# Patient Record
Sex: Female | Born: 1945
Health system: Southern US, Community
[De-identification: ages and names within clinical notes are randomized; demographics above are authoritative.]

## PROBLEM LIST (undated history)

## (undated) DIAGNOSIS — D509 Iron deficiency anemia, unspecified: Secondary | ICD-10-CM

## (undated) DIAGNOSIS — I10 Essential (primary) hypertension: Secondary | ICD-10-CM

## (undated) DIAGNOSIS — R7303 Prediabetes: Secondary | ICD-10-CM

## (undated) DIAGNOSIS — F419 Anxiety disorder, unspecified: Secondary | ICD-10-CM

## (undated) DIAGNOSIS — M1990 Unspecified osteoarthritis, unspecified site: Secondary | ICD-10-CM

## (undated) DIAGNOSIS — K219 Gastro-esophageal reflux disease without esophagitis: Secondary | ICD-10-CM

## (undated) DIAGNOSIS — E871 Hypo-osmolality and hyponatremia: Secondary | ICD-10-CM

## (undated) DIAGNOSIS — J309 Allergic rhinitis, unspecified: Secondary | ICD-10-CM

## (undated) DIAGNOSIS — M2550 Pain in unspecified joint: Secondary | ICD-10-CM

## (undated) DIAGNOSIS — M5412 Radiculopathy, cervical region: Secondary | ICD-10-CM

## (undated) DIAGNOSIS — J45909 Unspecified asthma, uncomplicated: Secondary | ICD-10-CM

## (undated) HISTORY — DX: Allergic rhinitis, unspecified: J30.9

## (undated) HISTORY — DX: Anxiety disorder, unspecified: F41.9

## (undated) HISTORY — DX: Hypo-osmolality and hyponatremia: E87.1

## (undated) HISTORY — DX: Essential (primary) hypertension: I10

## (undated) HISTORY — DX: Unspecified asthma, uncomplicated: J45.909

## (undated) HISTORY — DX: Radiculopathy, cervical region: M54.12

## (undated) HISTORY — DX: Gastro-esophageal reflux disease without esophagitis: K21.9

## (undated) HISTORY — PX: TOTAL ABDOMINAL HYSTERECTOMY W/ BILATERAL SALPINGOOPHORECTOMY: SHX83

## (undated) HISTORY — DX: Pain in unspecified joint: M25.50

## (undated) HISTORY — DX: Unspecified osteoarthritis, unspecified site: M19.90

## (undated) HISTORY — PX: OTHER SURGICAL HISTORY: SHX169

## (undated) HISTORY — DX: Iron deficiency anemia, unspecified: D50.9

---

## 1998-05-31 ENCOUNTER — Other Ambulatory Visit: Admission: RE | Admit: 1998-05-31 | Discharge: 1998-05-31 | Payer: Self-pay | Admitting: Obstetrics and Gynecology

## 1999-07-05 ENCOUNTER — Ambulatory Visit (HOSPITAL_COMMUNITY): Admission: RE | Admit: 1999-07-05 | Discharge: 1999-07-05 | Payer: Self-pay | Admitting: Gastroenterology

## 1999-07-06 ENCOUNTER — Ambulatory Visit (HOSPITAL_COMMUNITY): Admission: RE | Admit: 1999-07-06 | Discharge: 1999-07-06 | Payer: Self-pay | Admitting: Gastroenterology

## 1999-07-06 ENCOUNTER — Encounter: Payer: Self-pay | Admitting: Gastroenterology

## 1999-08-15 ENCOUNTER — Ambulatory Visit (HOSPITAL_BASED_OUTPATIENT_CLINIC_OR_DEPARTMENT_OTHER): Admission: RE | Admit: 1999-08-15 | Discharge: 1999-08-15 | Payer: Self-pay | Admitting: *Deleted

## 1999-08-23 ENCOUNTER — Other Ambulatory Visit: Admission: RE | Admit: 1999-08-23 | Discharge: 1999-08-23 | Payer: Self-pay | Admitting: *Deleted

## 1999-09-19 ENCOUNTER — Encounter: Payer: Self-pay | Admitting: Internal Medicine

## 1999-09-19 ENCOUNTER — Encounter: Admission: RE | Admit: 1999-09-19 | Discharge: 1999-09-19 | Payer: Self-pay | Admitting: Internal Medicine

## 1999-10-10 ENCOUNTER — Encounter: Payer: Self-pay | Admitting: Internal Medicine

## 1999-10-10 ENCOUNTER — Encounter: Admission: RE | Admit: 1999-10-10 | Discharge: 1999-10-10 | Payer: Self-pay | Admitting: Internal Medicine

## 2000-05-17 ENCOUNTER — Emergency Department (HOSPITAL_COMMUNITY): Admission: EM | Admit: 2000-05-17 | Discharge: 2000-05-17 | Payer: Self-pay | Admitting: Emergency Medicine

## 2000-05-17 ENCOUNTER — Encounter: Payer: Self-pay | Admitting: Emergency Medicine

## 2000-12-03 ENCOUNTER — Other Ambulatory Visit: Admission: RE | Admit: 2000-12-03 | Discharge: 2000-12-03 | Payer: Self-pay | Admitting: Obstetrics and Gynecology

## 2001-02-09 ENCOUNTER — Encounter: Payer: Self-pay | Admitting: Internal Medicine

## 2001-02-09 ENCOUNTER — Encounter: Admission: RE | Admit: 2001-02-09 | Discharge: 2001-02-09 | Payer: Self-pay | Admitting: Internal Medicine

## 2001-07-29 ENCOUNTER — Encounter: Payer: Self-pay | Admitting: Internal Medicine

## 2001-07-29 ENCOUNTER — Encounter: Admission: RE | Admit: 2001-07-29 | Discharge: 2001-07-29 | Payer: Self-pay | Admitting: Internal Medicine

## 2001-10-15 ENCOUNTER — Encounter: Admission: RE | Admit: 2001-10-15 | Discharge: 2001-11-10 | Payer: Self-pay | Admitting: Internal Medicine

## 2001-12-09 ENCOUNTER — Encounter: Payer: Self-pay | Admitting: Internal Medicine

## 2001-12-09 ENCOUNTER — Encounter: Admission: RE | Admit: 2001-12-09 | Discharge: 2001-12-09 | Payer: Self-pay | Admitting: Internal Medicine

## 2001-12-30 ENCOUNTER — Other Ambulatory Visit: Admission: RE | Admit: 2001-12-30 | Discharge: 2001-12-30 | Payer: Self-pay | Admitting: Obstetrics and Gynecology

## 2002-02-12 ENCOUNTER — Encounter: Admission: RE | Admit: 2002-02-12 | Discharge: 2002-02-12 | Payer: Self-pay | Admitting: Internal Medicine

## 2002-02-12 ENCOUNTER — Encounter: Payer: Self-pay | Admitting: Internal Medicine

## 2002-04-28 ENCOUNTER — Encounter: Payer: Self-pay | Admitting: Internal Medicine

## 2002-04-28 ENCOUNTER — Encounter: Admission: RE | Admit: 2002-04-28 | Discharge: 2002-04-28 | Payer: Self-pay | Admitting: Internal Medicine

## 2002-04-28 ENCOUNTER — Inpatient Hospital Stay (HOSPITAL_COMMUNITY): Admission: AD | Admit: 2002-04-28 | Discharge: 2002-04-30 | Payer: Self-pay | Admitting: Internal Medicine

## 2002-04-29 ENCOUNTER — Encounter: Payer: Self-pay | Admitting: Internal Medicine

## 2002-06-17 ENCOUNTER — Emergency Department (HOSPITAL_COMMUNITY): Admission: EM | Admit: 2002-06-17 | Discharge: 2002-06-17 | Payer: Self-pay | Admitting: *Deleted

## 2002-06-23 ENCOUNTER — Encounter: Payer: Self-pay | Admitting: Neurosurgery

## 2002-06-23 ENCOUNTER — Inpatient Hospital Stay (HOSPITAL_COMMUNITY): Admission: RE | Admit: 2002-06-23 | Discharge: 2002-06-25 | Payer: Self-pay | Admitting: Neurosurgery

## 2002-10-05 ENCOUNTER — Encounter: Payer: Self-pay | Admitting: Emergency Medicine

## 2002-10-05 ENCOUNTER — Emergency Department (HOSPITAL_COMMUNITY): Admission: EM | Admit: 2002-10-05 | Discharge: 2002-10-05 | Payer: Self-pay | Admitting: Emergency Medicine

## 2003-02-15 ENCOUNTER — Other Ambulatory Visit: Admission: RE | Admit: 2003-02-15 | Discharge: 2003-02-15 | Payer: Self-pay | Admitting: Obstetrics & Gynecology

## 2003-02-16 ENCOUNTER — Encounter: Admission: RE | Admit: 2003-02-16 | Discharge: 2003-02-16 | Payer: Self-pay | Admitting: Neurosurgery

## 2003-02-16 ENCOUNTER — Encounter: Payer: Self-pay | Admitting: Neurosurgery

## 2003-03-02 ENCOUNTER — Encounter: Payer: Self-pay | Admitting: Neurosurgery

## 2003-03-02 ENCOUNTER — Encounter: Admission: RE | Admit: 2003-03-02 | Discharge: 2003-03-02 | Payer: Self-pay | Admitting: Neurosurgery

## 2003-03-17 ENCOUNTER — Encounter: Admission: RE | Admit: 2003-03-17 | Discharge: 2003-03-17 | Payer: Self-pay | Admitting: Internal Medicine

## 2003-03-17 ENCOUNTER — Encounter: Payer: Self-pay | Admitting: Internal Medicine

## 2003-03-25 ENCOUNTER — Inpatient Hospital Stay (HOSPITAL_COMMUNITY): Admission: RE | Admit: 2003-03-25 | Discharge: 2003-03-29 | Payer: Self-pay | Admitting: Neurosurgery

## 2003-03-25 ENCOUNTER — Encounter: Payer: Self-pay | Admitting: Neurosurgery

## 2003-07-26 ENCOUNTER — Emergency Department (HOSPITAL_COMMUNITY): Admission: AD | Admit: 2003-07-26 | Discharge: 2003-07-26 | Payer: Self-pay | Admitting: Family Medicine

## 2004-02-09 ENCOUNTER — Encounter: Admission: RE | Admit: 2004-02-09 | Discharge: 2004-04-05 | Payer: Self-pay | Admitting: Neurosurgery

## 2004-07-14 ENCOUNTER — Emergency Department (HOSPITAL_COMMUNITY): Admission: EM | Admit: 2004-07-14 | Discharge: 2004-07-14 | Payer: Self-pay | Admitting: Emergency Medicine

## 2004-07-16 ENCOUNTER — Encounter: Admission: RE | Admit: 2004-07-16 | Discharge: 2004-07-16 | Payer: Self-pay | Admitting: Internal Medicine

## 2004-10-04 ENCOUNTER — Encounter: Admission: RE | Admit: 2004-10-04 | Discharge: 2004-11-07 | Payer: Self-pay | Admitting: Internal Medicine

## 2004-11-19 ENCOUNTER — Encounter: Admission: RE | Admit: 2004-11-19 | Discharge: 2004-11-19 | Payer: Self-pay | Admitting: Internal Medicine

## 2005-01-21 ENCOUNTER — Emergency Department (HOSPITAL_COMMUNITY): Admission: EM | Admit: 2005-01-21 | Discharge: 2005-01-21 | Payer: Self-pay | Admitting: Emergency Medicine

## 2005-02-06 ENCOUNTER — Encounter: Admission: RE | Admit: 2005-02-06 | Discharge: 2005-03-20 | Payer: Self-pay | Admitting: Internal Medicine

## 2005-02-22 ENCOUNTER — Ambulatory Visit (HOSPITAL_COMMUNITY): Admission: RE | Admit: 2005-02-22 | Discharge: 2005-02-22 | Payer: Self-pay | Admitting: Neurosurgery

## 2005-05-01 ENCOUNTER — Emergency Department (HOSPITAL_COMMUNITY): Admission: EM | Admit: 2005-05-01 | Discharge: 2005-05-01 | Payer: Self-pay | Admitting: Emergency Medicine

## 2005-07-23 ENCOUNTER — Encounter: Admission: RE | Admit: 2005-07-23 | Discharge: 2005-07-23 | Payer: Self-pay | Admitting: Obstetrics and Gynecology

## 2005-10-01 ENCOUNTER — Emergency Department (HOSPITAL_COMMUNITY): Admission: EM | Admit: 2005-10-01 | Discharge: 2005-10-02 | Payer: Self-pay | Admitting: Emergency Medicine

## 2005-10-08 ENCOUNTER — Encounter: Admission: RE | Admit: 2005-10-08 | Discharge: 2005-10-08 | Payer: Self-pay | Admitting: Internal Medicine

## 2005-10-11 ENCOUNTER — Encounter: Admission: RE | Admit: 2005-10-11 | Discharge: 2005-10-11 | Payer: Self-pay | Admitting: Internal Medicine

## 2007-08-25 ENCOUNTER — Encounter: Admission: RE | Admit: 2007-08-25 | Discharge: 2007-08-25 | Payer: Self-pay | Admitting: Internal Medicine

## 2007-10-16 ENCOUNTER — Encounter: Admission: RE | Admit: 2007-10-16 | Discharge: 2007-10-16 | Payer: Self-pay | Admitting: Sports Medicine

## 2009-01-03 ENCOUNTER — Inpatient Hospital Stay (HOSPITAL_COMMUNITY): Admission: EM | Admit: 2009-01-03 | Discharge: 2009-01-04 | Payer: Self-pay | Admitting: Emergency Medicine

## 2009-01-04 ENCOUNTER — Ambulatory Visit: Payer: Self-pay | Admitting: *Deleted

## 2009-01-04 ENCOUNTER — Encounter (INDEPENDENT_AMBULATORY_CARE_PROVIDER_SITE_OTHER): Payer: Self-pay | Admitting: Internal Medicine

## 2009-03-25 ENCOUNTER — Encounter: Admission: RE | Admit: 2009-03-25 | Discharge: 2009-03-25 | Payer: Self-pay | Admitting: Neurosurgery

## 2009-06-21 ENCOUNTER — Encounter: Admission: RE | Admit: 2009-06-21 | Discharge: 2009-06-21 | Payer: Self-pay | Admitting: Obstetrics and Gynecology

## 2009-07-03 ENCOUNTER — Encounter: Admission: RE | Admit: 2009-07-03 | Discharge: 2009-07-03 | Payer: Self-pay | Admitting: Gastroenterology

## 2009-07-07 ENCOUNTER — Ambulatory Visit (HOSPITAL_BASED_OUTPATIENT_CLINIC_OR_DEPARTMENT_OTHER): Admission: RE | Admit: 2009-07-07 | Discharge: 2009-07-07 | Payer: Self-pay | Admitting: Orthopedic Surgery

## 2010-08-09 ENCOUNTER — Ambulatory Visit (HOSPITAL_COMMUNITY)
Admission: RE | Admit: 2010-08-09 | Discharge: 2010-08-09 | Disposition: A | Discharge status: Home / Self Care | Payer: Medicare Other | Source: Ambulatory Visit | Attending: Neurosurgery | Admitting: Neurosurgery

## 2010-08-09 ENCOUNTER — Other Ambulatory Visit (HOSPITAL_COMMUNITY): Payer: Self-pay | Admitting: Neurosurgery

## 2010-08-09 ENCOUNTER — Encounter (HOSPITAL_COMMUNITY)
Admission: RE | Admit: 2010-08-09 | Discharge: 2010-08-09 | Disposition: A | Discharge status: Home / Self Care | Payer: Medicare Other | Source: Ambulatory Visit | Attending: Neurosurgery | Admitting: Neurosurgery

## 2010-08-09 DIAGNOSIS — IMO0002 Reserved for concepts with insufficient information to code with codable children: Secondary | ICD-10-CM

## 2010-08-09 DIAGNOSIS — Z01818 Encounter for other preprocedural examination: Secondary | ICD-10-CM | POA: Insufficient documentation

## 2010-08-09 DIAGNOSIS — Z01810 Encounter for preprocedural cardiovascular examination: Secondary | ICD-10-CM | POA: Insufficient documentation

## 2010-08-09 DIAGNOSIS — Z01812 Encounter for preprocedural laboratory examination: Secondary | ICD-10-CM | POA: Insufficient documentation

## 2010-08-09 LAB — CBC
Hemoglobin: 13.9 g/dL (ref 12.0–15.0)
MCH: 26 pg (ref 26.0–34.0)
MCHC: 32.7 g/dL (ref 30.0–36.0)
MCV: 79.4 fL (ref 78.0–100.0)
Platelets: 311 10*3/uL (ref 150–400)

## 2010-08-09 LAB — BASIC METABOLIC PANEL
Chloride: 106 mEq/L (ref 96–112)
GFR calc non Af Amer: >60 mL/min (ref >60)
Glucose, Bld: 104 mg/dL — ABNORMAL HIGH (ref 70–99)
Potassium: 3.8 mEq/L (ref 3.5–5.1)

## 2010-08-09 LAB — TYPE AND SCREEN: ABO/RH(D): O POS

## 2010-08-15 ENCOUNTER — Inpatient Hospital Stay (HOSPITAL_COMMUNITY)
Admission: RE | Admit: 2010-08-15 | Discharge: 2010-08-21 | DRG: 460 | Disposition: A | Discharge status: Home Health | Payer: Medicare Other | Source: Ambulatory Visit | Attending: Neurosurgery | Admitting: Neurosurgery

## 2010-08-15 ENCOUNTER — Inpatient Hospital Stay (HOSPITAL_COMMUNITY): Payer: Medicare Other

## 2010-08-15 DIAGNOSIS — M5126 Other intervertebral disc displacement, lumbar region: Principal | ICD-10-CM | POA: Diagnosis present

## 2010-08-15 DIAGNOSIS — M5137 Other intervertebral disc degeneration, lumbosacral region: Secondary | ICD-10-CM | POA: Diagnosis present

## 2010-08-15 DIAGNOSIS — M51379 Other intervertebral disc degeneration, lumbosacral region without mention of lumbar back pain or lower extremity pain: Secondary | ICD-10-CM | POA: Diagnosis present

## 2010-08-15 DIAGNOSIS — B370 Candidal stomatitis: Secondary | ICD-10-CM | POA: Diagnosis not present

## 2010-08-19 LAB — CBC
HCT: 34.2 % — ABNORMAL LOW (ref 36.0–46.0)
Hemoglobin: 11.1 g/dL — ABNORMAL LOW (ref 12.0–15.0)
MCH: 25.6 pg — ABNORMAL LOW (ref 26.0–34.0)
MCV: 79 fL (ref 78.0–100.0)
RBC: 4.33 MIL/uL (ref 3.87–5.11)
WBC: 7.8 10*3/uL (ref 4.0–10.5)

## 2010-08-20 DIAGNOSIS — IMO0002 Reserved for concepts with insufficient information to code with codable children: Secondary | ICD-10-CM

## 2010-08-20 DIAGNOSIS — M47817 Spondylosis without myelopathy or radiculopathy, lumbosacral region: Secondary | ICD-10-CM

## 2010-08-23 NOTE — Op Note (Signed)
NAME:  Melinda Sanford, TROSTLE NO.:  [id number]  MEDICAL RECORD NO.:  [id number]           PATIENT TYPE:  I  LOCATION:  3025                         FACILITY:  [facility]  PHYSICIAN:  Donalee Citrin, M.D.        DATE OF BIRTH:  July 14, 1945  DATE OF PROCEDURE:  08/15/2010 DATE OF DISCHARGE:                              OPERATIVE REPORT   PREOPERATIVE DIAGNOSES:  Recurrent disk herniation L4-5 left with left side L4-5 radiculopathy and severe mechanical low back pain.  PROCEDURES:  Redo decompressive laminectomy L4-5, posterior lumbar body fusion L4-5 using a hybrid Telamon PEEK cage packed with locally harvested autograft mixed with Actifuse and tangent allograft wedges at L4-5, posterior lateral arthrodesis L4-5, pedicle screw fixation L4-5 using the layer of cobalt chrome pedicle screw system.  SURGEON:  Donalee Citrin, MD  ASSISTANT:  Tia Alert, MD  ASSISTANT:  General endotracheal.  HISTORY OF PRESENT ILLNESS:  The patient is a 65 year old female who has had 2 previous back operations back in 2003 and 2004 for disk herniations, who presents with progressive worsening back pain, bilateral leg pain worse on the left, consistent with an L4 nerve root pattern.  Had an MRI scan showed a very large disk herniation, recurrent underneath the facet complex and due to the size, location and fragment. In fact during the total operation, there was no way to decompressing that disk out and decompressing the L4 without doing a complete facetectomy on that side, so the patient is recommended a redo decompression and stabilization procedure.  I went over the risks and benefits of the operation with the patient.  She understood and agreed to proceed forward.  PROCEDURE IN DETAIL:  The patient was brought to the OR, was induced general anesthesia, positioned supine, her back was prepped and draped in sterile fashion.  The old incision was opened up and extended cephalocaudally.  The  scar tissue was dissected free and subperiosteal dissection was carried on the lamina of L4 and L5 bilaterally exposing the T-piece at L4 and L5 bilaterally.  Intraoperative x-ray identified at the appropriate level.  The spinous processes were removed and central decompression was begun.  Complete medial facetectomies were performed bilaterally.  There was extensive amount of scar tissue on the patient's left side and a large recurrence rarely at the facet complex displacing the L4 nerve root superiorly.  The L4 nerve was dissected off the disk herniation and after completing the adequate unroofing of all the neuroforamen bilateral L4 and L5 foraminotomies.  Attention was taken first of the pedicle screw placement using a high-speed drill and fluoroscopy.  Pilot holes were drilled.  High-speed drill was used.  The pilot holes pedicles were cannulated with the awl, probed, tapped with a 5 x 5 tap, probed again and a 6 x 45 screw inserted at L4 and 6 x 45 at L5.  Again, fluoroscopy used at each step along the way using external and internal bony landmarks and probing within the pedicle it was confirmed no mediolateral breach.  After all screws were placed, attention was taken to interbody work first working on the  redo size.  A size 8 distractor was inserted, then a subsequent 10 distractor was inserted on the contralateral side and then working with a 10 distractor in place, diskectomy was performed at L4-5 on the left.  There was extensive amount of scar underneath the 4 root as well as a small recurrent stuck underneath the 4 root, so teased away as much as recurrence as I could.  We will do the extensive fibrosis and manipulation of the forehead around the dorsal ganglion distally.  I was unable to free up all that scar tissue due to years of excessive manipulation and damage to the 4 root, so a complete unroofing of that nerve significantly distally proven to ensure it was  adequately decompressed despite the fact there was still some epidural fibrosis underneath it and possibly still a small piece of recurrent of this.  I was unable to free up from the nerve, but after adequate decompression of that nerve achieved the disk space was then cleaned out using a size 10 cutter and chisel.  Again, fluoroscopy using the Telamon PEEK cage packed with locally harvested autograft mixed with Actifuse was inserted on the patient's left side.  Then, the right side diskectomy was performed in a similar fashion.  Local autograft was packed centrally along with Actifuse and a right-sided tangent was inserted.  After all interbody work was done, it was copiously irrigated.  Meticulous hemostasis was maintained.  Aggressive decortication was carried in T- piece and lateral gutters.  The remainder of the post autograft was packed posterolaterally.  The 40-mm rods were then connected top to dissect down at L5.  The L4 screw was compressed against L5.  The foramina were all then reinspected to confirm patency and then the wound was copiously irrigated.  Meticulous hemostasis was maintained.  A large Hemovac drain was placed.  The wound was closed in layers with interrupted Vicryl and the skin was closed in a running 4-0 subcuticular.  Benzoin and Steri-Strips were applied.  The patient went to recovery room in stable condition.          ______________________________ Donalee Citrin, M.D.     GC/MEDQ  D:  08/15/2010  T:  08/16/2010  Job:  [id number]  Electronically Signed by Donalee Citrin M.D. on 08/22/2010 04:01:56 PM

## 2010-08-23 NOTE — Discharge Summary (Signed)
  NAME:  Melinda Sanford, Melinda Sanford NO.:  [id number]  MEDICAL RECORD NO.:  [id number]           PATIENT TYPE:  I  LOCATION:  3025                         FACILITY:  [facility]  PHYSICIAN:  Donalee Citrin, M.D.        DATE OF BIRTH:  01-Sep-1945  DATE OF ADMISSION:  08/15/2010 DATE OF DISCHARGE:  08/21/2010                              DISCHARGE SUMMARY   ADMITTING DIAGNOSES:  Degenerative disk disease, lumbar spinal stenosis, and recurrent disk herniation at L4-5.  PROCEDURE:  Redo decompressive laminectomy and stabilization procedure, L4-5.  HOSPITAL COURSE:  The patient was admitted as an EME, went to the operating room, and underwent the aforementioned procedure. Postoperatively, the patient did very well, recovered in the floor, had a fair amount of pain postoperatively initially all in her back with no real leg pain.  However, she was very slow to mobilize.  Physical therapy worked with her immediately on first and second postoperative day and slowly mobilized her over those days, and the patient initially did very well with that.  However, over the next couple of days, started experiencing some worsening pain in lower part of back radiating into the quads.  The patient was placed on a dose of Decadron.  She also was complaining of a sore throat, had some evidence of thrush on her oral exam, and was also started on nystatin mouth wash.  About 24 hours after these two interventions, the patient was doing significantly better, ambulating and voiding spontaneously.  Pain was well controlled on pills.  Wound was clean and dry, and the patient was ready to be discharged home.  She has scheduled followup in 1 week and she was discharged with home health physical therapy and discharged on her previously taken medications to include fentanyl patch, cyclobenzaprine, oxycodone, and sent home with nystatin mouth wash.          ______________________________ Donalee Citrin,  M.D.     GC/MEDQ  D:  08/21/2010  T:  08/21/2010  Job:  [id number]  Electronically Signed by Donalee Citrin M.D. on 08/22/2010 04:02:00 PM

## 2010-09-03 LAB — BASIC METABOLIC PANEL
BUN: 13 mg/dL (ref 6–23)
CO2: 24 mEq/L (ref 19–32)
Chloride: 100 mEq/L (ref 96–112)
Creatinine, Ser: 0.86 mg/dL (ref 0.4–1.2)
Glucose, Bld: 129 mg/dL — ABNORMAL HIGH (ref 70–99)
Potassium: 3.4 mEq/L — ABNORMAL LOW (ref 3.5–5.1)

## 2010-09-03 LAB — POCT HEMOGLOBIN-HEMACUE: Hemoglobin: 13.1 g/dL (ref 12.0–15.0)

## 2010-09-20 ENCOUNTER — Other Ambulatory Visit: Payer: Self-pay | Admitting: Neurosurgery

## 2010-09-20 ENCOUNTER — Ambulatory Visit
Admission: RE | Admit: 2010-09-20 | Discharge: 2010-09-20 | Disposition: A | Discharge status: Home / Self Care | Payer: Medicare Other | Source: Ambulatory Visit | Attending: Neurosurgery | Admitting: Neurosurgery

## 2010-09-20 DIAGNOSIS — M545 Low back pain: Secondary | ICD-10-CM

## 2010-09-23 LAB — BASIC METABOLIC PANEL
BUN: 7 mg/dL (ref 6–23)
CO2: 27 mEq/L (ref 19–32)
CO2: 29 mEq/L (ref 19–32)
Calcium: 9.3 mg/dL (ref 8.4–10.5)
Chloride: 106 mEq/L (ref 96–112)
Creatinine, Ser: 1.02 mg/dL (ref 0.4–1.2)
GFR calc Af Amer: >60 mL/min (ref >60)
Glucose, Bld: 106 mg/dL — ABNORMAL HIGH (ref 70–99)
Glucose, Bld: 124 mg/dL — ABNORMAL HIGH (ref 70–99)
Sodium: 144 mEq/L (ref 135–145)

## 2010-09-23 LAB — DIFFERENTIAL
Basophils Absolute: 0 10*3/uL (ref 0.0–0.1)
Eosinophils Absolute: 0 10*3/uL (ref 0.0–0.7)
Eosinophils Relative: 1 % (ref 0–5)
Lymphocytes Relative: 36 % (ref 12–46)
Monocytes Absolute: 0.3 10*3/uL (ref 0.1–1.0)

## 2010-09-23 LAB — COMPREHENSIVE METABOLIC PANEL
ALT: 15 U/L (ref 0–35)
AST: 17 U/L (ref 0–37)
Alkaline Phosphatase: 59 U/L (ref 39–117)
CO2: 31 mEq/L (ref 19–32)
Chloride: 102 mEq/L (ref 96–112)
GFR calc Af Amer: >60 mL/min (ref >60)
GFR calc non Af Amer: >60 mL/min (ref >60)
Glucose, Bld: 93 mg/dL (ref 70–99)
Sodium: 142 mEq/L (ref 135–145)
Total Bilirubin: 1 mg/dL (ref 0.3–1.2)

## 2010-09-23 LAB — LIPID PANEL
Cholesterol: 197 mg/dL (ref 0–200)
LDL Cholesterol: 75 mg/dL (ref 0–99)
Triglycerides: 112 mg/dL (ref <150)

## 2010-09-23 LAB — HEMOGLOBIN A1C: Hgb A1c MFr Bld: 5.6 % (ref 4.6–6.1)

## 2010-09-23 LAB — CBC
HCT: 43.4 % (ref 36.0–46.0)
Hemoglobin: 14.2 g/dL (ref 12.0–15.0)
MCV: 81.6 fL (ref 78.0–100.0)
MCV: 81.8 fL (ref 78.0–100.0)
Platelets: 325 10*3/uL (ref 150–400)
Platelets: 339 10*3/uL (ref 150–400)
RBC: 5.63 MIL/uL — ABNORMAL HIGH (ref 3.87–5.11)
RDW: 14.1 % (ref 11.5–15.5)
WBC: 6.1 10*3/uL (ref 4.0–10.5)

## 2010-09-23 LAB — CARDIAC PANEL(CRET KIN+CKTOT+MB+TROPI)
CK, MB: 0.8 ng/mL (ref 0.3–4.0)
Relative Index: INVALID (ref 0.0–2.5)
Total CK: 52 U/L (ref 7–177)
Troponin I: 0.01 ng/mL (ref 0.00–0.06)

## 2010-09-23 LAB — POCT I-STAT, CHEM 8
BUN: 9 mg/dL (ref 6–23)
Calcium, Ion: 1.09 mmol/L — ABNORMAL LOW (ref 1.12–1.32)
Creatinine, Ser: 1 mg/dL (ref 0.4–1.2)
Hemoglobin: 16 g/dL — ABNORMAL HIGH (ref 12.0–15.0)
Sodium: 140 mEq/L (ref 135–145)
TCO2: 26 mmol/L (ref 0–100)

## 2010-09-23 LAB — HOMOCYSTEINE: Homocysteine: 8.2 umol/L (ref 4.0–15.4)

## 2010-10-16 ENCOUNTER — Other Ambulatory Visit: Payer: Self-pay | Admitting: Internal Medicine

## 2010-10-17 ENCOUNTER — Ambulatory Visit
Admission: RE | Admit: 2010-10-17 | Discharge: 2010-10-17 | Disposition: A | Discharge status: Home / Self Care | Payer: Medicare Other | Source: Ambulatory Visit | Attending: Internal Medicine | Admitting: Internal Medicine

## 2010-10-17 MED ORDER — IOHEXOL 300 MG/ML  SOLN
100.0000 mL | Freq: Once | INTRAMUSCULAR | Status: AC | PRN
  Administered 2010-10-17: 100 mL via INTRAVENOUS

## 2010-10-30 NOTE — H&P (Signed)
NAME:  Melinda Sanford, Melinda Sanford NO.:  [id number]   MEDICAL RECORD NO.:  [id number]          PATIENT TYPE:  INP   LOCATION:  3039                         FACILITY:  [facility]   PHYSICIAN:  Hollice Espy, M.D.DATE OF BIRTH:  Nov 06, 1945   DATE OF ADMISSION:  01/03/2009  DATE OF DISCHARGE:                              HISTORY & PHYSICAL   PRIMARY CARE PHYSICIAN:  Candyce Churn, MD   CHIEF COMPLAINT:  Transient dysarthria with left facial droop and left-  sided numbness on the day prior to admission.   HISTORY OF PRESENT ILLNESS:  Melinda Sanford is a 65 year old African  American female with history of hypertension and migraine headaches who  presents to [facility] Emergency Room with complaints of left-sided  numbness x4 days with episode of dysarthria and left facial droop on  Sunday prior to this admission.  The patient's friend at bedside reports  noticing left facial droop while at the patient's home on Sunday  afternoon that quickly resolved.  Of note, the patient did have trigger  point injections on her face and neck on Monday December 26, 2008, at [facility] for persistent migraine headache.  The  patient also reports positive orthopnea times several months.  She  denies any recent chest pain, fever, dizziness, abdominal pain, nausea,  vomiting, diarrhea, or generalized weakness.   PAST MEDICAL HISTORY:  1. Hypertension.  2. Headache.  3. GERD.  4. Chronic back pain.   MEDICATIONS:  1. Hydrochlorothiazide 12.5 mg p.o. daily.  2. Estrace 1 mg p.o. daily.  3. Protonix 40 mg p.o. daily.  4. Topamax 50 mg p.o. daily.  5. Citalopram 20 mg p.o. daily.  6. Allegra 180 mg p.o. daily.  7. Xopenex HFA 45 mcg inhaled p.r.n.  8. Flexeril 10 mg p.o. daily p.r.n.  9. Lidoderm 5% patch apply daily p.r.n.  10.Oxycodone/APAP 5/325 p.o. b.i.d. p.r.n.   ALLERGIES:  CODEINE and PENICILLIN.   FAMILY HISTORY:  Father deceased at 74 years old with  prostate cancer.  Mother is alive at 52 with history of pacemaker insertion.  Brother is  alive at 12 with history of CVA, CAD, hypertension, and diabetes.   SOCIAL HISTORY:  The patient is married.  She has 1 child living, 1  child deceased in his early 44s by drowning.  The patient with a history  of remote tobacco abuse in the 60s.  No history of EtOH use.   REVIEW OF SYSTEMS:  As mentioned in the HPI, otherwise, negative.   PHYSICAL EXAMINATION:  VITAL SIGNS:  Blood pressure 134/76, heart rate  67, respirations 13, temperature 98.5, and O2 sat is 99% on room air.  GENERAL:  This is a well-nourished, well-developed, African American  female, awake and alert, in no acute distress.  HEENT:  Head is normocephalic and atraumatic.  Eyes, pupils are equal,  round, and reactive to light.  No scleral icterus or injection.  Extraocular movements are intact.  Ears, nose and throat, mucous  membranes are moist.  No oropharyngeal lesions.  NECK:  Supple without any thyromegaly or lymphadenopathy.  CHEST:  Symmetrical movement, nontender to palpation.  CARDIOVASCULAR:  S1 and S2.  Regular rate and rhythm.  No JVD or carotid  bruits.  No lower extremity edema.  RESPIRATORY:  The patient's lung sounds are clear to auscultation  bilaterally.  No wheezes, rales, or crackles.  No increased work of  breathing.  GI:  Abdomen is soft, nontender, nondistended with positive bowel  sounds.  No appreciated masses or hepatosplenomegaly.  MUSCULOSKELETAL:  The patient without any joint deformity or swelling.  NEUROLOGIC:  The patient able to move all extremities x4; however, the  patient with 4/5 strength in left lower extremity.  The patient without  pronator drift.  No facial asymmetry noted on exam.  PSYCHOLOGIC:  The patient is alert and oriented x3 with very pleasant  mood and affect.   LABORATORY DATA AND X-RAYS:  White cell count 5.6, platelet count 339,  and hemoglobin 14.2.  Sodium 140, potassium  2.7, BUN 9, and creatinine  1.0.  EKG, normal sinus rhythm at 74 beats per minute.   CT of the head with no acute findings, minor chronic small vessel  changes.   IMPRESSION AND PLAN:  1. Left-sided numbness with transient dysarthria and facial droop.      Question cerebrovascular accident versus transient ischemic attack.      CT of the head done in the emergency room on admission is negative      for any acute findings.  We will admit the patient overnight to      telemetry unit for complete cerebrovascular accident workup to      include MRI and MRA of the brain, carotid Doppler studies and 2-D      echocardiogram.  We will cycle cardiac enzymes.  Check homocysteine      level as well as fasting lipid profile.  We will also start aspirin      at full dose.  2. Orthopnea.  Unclear etiology.  As the patient states she has been      suffering from orthopnea for several months, we will check BMP.  We      will check 2-D echo and cycle cardiac enzymes as mentioned with      left-sided numbness with transient dysarthria and left facial      droop.  3. Hypokalemia.  Question relation to left-sided numbness with      transient dysarthria and left facial droop.  Low potassium is      likely secondary to home diuretic use.  We will replete IV and      recheck in a.m.  The patient will likely need supplemental      potassium upon discharge from hospital.  4. Hypertension.  Continue home medications.  5. Chronic back pain.  Continue home medications.  6. Gastroesophageal reflux disease.  Continue PPI.  7. Headache.  Continue Topamax.  8. Prophylaxis.  Unfractionated heparin for deep venous thrombosis      prophylaxis.      Cordelia Pen, NP      Hollice Espy, M.D.  Electronically Signed    LE/MEDQ  D:  01/03/2009  T:  01/04/2009  Job:  [id number]   cc:   Candyce Churn, M.D.

## 2010-10-30 NOTE — Discharge Summary (Signed)
[name], Sanford NO.:  [id number]   MEDICAL RECORD NO.:  [id number]          PATIENT TYPE:  INP   LOCATION:  3039                         FACILITY:  [facility]   PHYSICIAN:  Candyce Churn, M.D.DATE OF BIRTH:  16-Aug-1945   DATE OF ADMISSION:  01/03/2009  DATE OF DISCHARGE:  01/04/2009                               DISCHARGE SUMMARY   DISCHARGE DIAGNOSES:  1. Transient dysarthria, left-sided numbness suggestive of transient      ischemic attack.  May have been vascular spasm secondary to      migraine.  2. Complain of orthopnea, resolved.  3. Hypokalemia, likely secondary to HCTZ therapy.  We will replace      potassium-wasting diuretics with potassium-sparing diuretic, i.e.,      Maxzide.  4. Hypertension, controlled.  5. History of gastroesophageal reflux disease.  6. History of migraine headaches, continue Topamax.  7. History of estrogen replacement therapy - hold for now, but may      restart in 2 weeks.   PROCEDURES:  1. MRI/MRA of the brain performed January 03, 2009, revealed no      significant occlusion or stenosis or aneurysms and no evidence of      acute ischemia or stroke.  There were some nonspecific subcortical      white matter changes supratentorially which may represent areas of      ischemic gliosis related to small vessel disease or hypertension.      There was mild inflammatory thickening of the mucosa in the ethmoid      and maxillary sinus.  2. A 2-D echocardiogram, results pending.  3. Carotid Dopplers performed on January 04, 2009, with preliminary      results being revealing no evidence of ulceration or occlusion.   HOSPITAL COURSE:  Melinda Sanford is a very pleasant 65 year old African  American female with history of hypertension and migraine headaches who  had not been taking some of her medications over the past week.  She  presented to the emergency room with left-sided numbness for 4 days and  dysarthria and apparent left  facial droop 2 days prior to admission.  A  friend apparently noticed a left facial droop that quickly resolved.  She had been having migraine headaches as well, for which she takes  chronic Topamax.  With these symptoms, she was admitted for further  evaluation and workup for possible symptoms of TIA.   While hospitalized, her blood pressure had been controlled and symptoms  of dysarthria, numbness, and weakness have resolved.  There is no  evidence to suggest any obvious intracerebral vascular disease and  carotid Dopplers appeared normal as well.  A 2-D echo is pending.  She  feels back to her baseline at the time of discharge.   DISCHARGE LABORATORIES:  Sodium 142.  Potassium 3.2 this morning with  supplemental therapy today with repeat potassium to be performed this  afternoon.  Chloride 106, bicarb 29, glucose 124, BUN 9, creatinine  1.02, calcium 8.9, cholesterol 197, triglycerides 112, HDL 100, LDL 75,  and total cholesterol/HDL ratio of 2.0.  CPK serially  revealed normal CK  levels and CK-MB of less than 1 and troponin Is of 0.01.  White blood  cell count 6100, hemoglobin 15.0, and platelet count 325,000.  Hemoglobin A1c was normal at 5.6%.   HOSPITAL COURSE:  Melinda Sanford was admitted with complaint of left-sided  numbness for 4 days and an episode of dysarthria 2 days prior to  admission.  Apparently, she had a left facial droop while at home 2 days  prior to admission.  Workup has been benign thus far, but 2-D echo is  pending.  Dysarthria, numbness, and weakness have resolved.   I suspect that this may be migraine with vascular spasm, but we will  reinstitute a potassium-sparing diuretic at discharge in the form of  Maxzide 37.5/25 mg one-half tablet daily.  We will plan to hold Estrace  for now, but we will continue Protonix, citalopram, Topamax, and  Allegra.   CONDITION ON DISCHARGE:  Much improved and we will plan to see back in  the office in approximately 10 days  for followup and if any recurrent  neurological symptoms occur such as dysarthria, weakness, or numbness,  the patient will contact us immediately and she has been instructed to  do so.   MEDICATIONS:  1. Maxzide 37.5/25 one-half tablet daily.  2. Estrace 1 mg daily - hold for now.  3. Protonix 40 mg daily.  4. Topamax 50 mg p.o. at bedtime.  5. Citalopram 20 mg daily.  6. Allegra 180 mg daily p.r.n.  7. Xopenex HFA inhaler 1-2 inhalations 3-4 times daily p.r.n. symptoms      of wheezing and cough.  8. Flexeril 10 mg p.o. at bedtime p.r.n. muscle spasm.  9. Promethazine 25 mg p.r.n. nausea.  We will hold Lidoderm patch for now.   CONDITION ON DISCHARGE:  Much improved.   PLAN:  To see back in the office 7-10 days and we will follow up on 2-D  echocardiogram.      Candyce Churn, M.D.  Electronically Signed     RNG/MEDQ  D:  01/04/2009  T:  01/05/2009  Job:  [id number]

## 2010-11-01 ENCOUNTER — Ambulatory Visit
Admission: RE | Admit: 2010-11-01 | Discharge: 2010-11-01 | Disposition: A | Discharge status: Home / Self Care | Payer: Medicare Other | Source: Ambulatory Visit | Attending: Neurosurgery | Admitting: Neurosurgery

## 2010-11-01 ENCOUNTER — Other Ambulatory Visit: Payer: Self-pay | Admitting: Neurosurgery

## 2010-11-01 DIAGNOSIS — M25559 Pain in unspecified hip: Secondary | ICD-10-CM

## 2010-11-01 DIAGNOSIS — M545 Low back pain: Secondary | ICD-10-CM

## 2010-11-01 DIAGNOSIS — M79609 Pain in unspecified limb: Secondary | ICD-10-CM

## 2010-11-02 NOTE — H&P (Signed)
NAME:  Melinda Sanford, Melinda Sanford NO.:  [id number]   MEDICAL RECORD NO.:  [id number]                   PATIENT TYPE:  INP   LOCATION:  2860                                 FACILITY:  [facility]   PHYSICIAN:  Hilda Lias, M.D.                DATE OF BIRTH:  1946-06-01   DATE OF ADMISSION:  06/23/2002  DATE OF DISCHARGE:                                HISTORY & PHYSICAL   HISTORY OF PRESENT ILLNESS:  Melinda Sanford is a lady who was seen by me in my  office because of back pain that radiates down to the left leg for almost  five days.  The patient was then seem in the emergency room and from then on  she was seen by Dr. Eulah Pont who obtained an MRI of the patient as an  emergency.  This lady had so much pain to the point that she came to my  office in a wheelchair.  The pain shoots all the way down the left leg to  the foot and now into the right leg.   PAST MEDICAL HISTORY:  She has had a hysterectomy, tubal ligation, cancer of  the cervix, bladder surgery, and peroneal nerve surgery of the left leg.   ALLERGIES:  1. PENICILLIN.  2. CODEINE.  3. SEPTRA.  4. VERAPAMIL.   SOCIAL HISTORY:  Negative.   FAMILY HISTORY:  Melinda Sanford is 16 in good condition.   REVIEW OF SYMPTOMS:  Positive for nasal congestion, sinus headaches, high  blood pressure, cancer of the uterus, leg weakness, nausea.   PHYSICAL EXAMINATION:  GENERAL:  The patient came to my office with her son  and niece.  She had difficulty getting out of bed.  It took [place] at least 10  minutes for her to get from the wheelchair to the examination table.  HEENT:  Normal.  NECK:  Normal.  LUNGS:  Clear.  HEART:  Heart sounds normal.  ABDOMEN: There is a scar from previous surgery.  EXTREMITIES:  Normal.  NEUROLOGIC:  Muscle strength is normal.  Cranial nerves normal.  Strength  5/5. It is said that she has weakness of the left foot __________ and  dorsiflexion of the left foot.  Difficult to assess secondary to  the amount  of pain she is having.  She has a scar in the left knee where she had  peroneal decompression.  She had __________.  The right side is normal.   LABORATORY DATA:  The MRI showed that she has a large herniated disc of the  L4-5 foramen compromising the L4 and L5 level.   IMPRESSION:  Left L4-5 herniated disc with extra-foraminal component  compromising mostly the L4 area.   RECOMMENDATIONS:  The patient wants surgery.  The surgery was fully  explained to her, her son and niece.  The risks include infection, need for  further surgery, CSF leak, worsening pain and other  risks associated with  history.  The procedure will be we are going to do an extra-foraminal  approach using the [employer] system.                                               Hilda Lias, M.D.    EB/MEDQ  D:  06/23/2002  T:  06/23/2002  Job:  [id number]

## 2010-11-02 NOTE — H&P (Signed)
NAME:  Melinda Sanford, Melinda Sanford NO.:  [id number]   MEDICAL RECORD NO.:  [id number]                   PATIENT TYPE:  INP   LOCATION:  3172                                 FACILITY:  [facility]   PHYSICIAN:  Hilda Lias, M.D.                DATE OF BIRTH:  10/19/1945   DATE OF ADMISSION:  03/25/2003  DATE OF DISCHARGE:                                HISTORY & PHYSICAL   Melinda Sanford is a lady who underwent left L4-5 diskectomy several months ago.  Also, the patient is well known to have problem with left knee.  She had  been waiting to have surgery to repair the left knee.  She had surgery at  the level of 4-5.  She continued to have some back pain which radiates down  to the left hip and then down to the left knee with a burning sensation.  The patient has had conservative treatment and she is not any better.  The  surgeon will not do anything until her problem with the lumbar spine has  been taken care of.  Because of the worsening of the pain, the patient  decided to have surgery.   PAST MEDICAL HISTORY:  1. Hysterectomy.  2. Tubal ligation.  3. Cervical cancer.  4. Bladder surgery,  5. Some type of surgery in the left knee.   ALLERGIES:  1. CODEINE.  2. PENICILLIN.  3. SEPTRA.  4. VERAPAMIL.   FAMILY HISTORY:  Remarkable with a sister who has positive high blood  pressure, leg pain, history of cancer of the uterus.   PHYSICAL EXAMINATION:  GENERAL:  The patient came to my office and was  complaining of pain down to left leg.  HEENT:  Normal.  NECK:  Normal.  LUNGS: Clear.  HEART:  Normal.  ABDOMEN:  Normal.  EXTREMITIES:  There is a scar on the left knee from previous surgery.  NEURO:  Mental status normal.  Cranial nerves normal.  Sensation:  She  complains of a burning sensation which affects the left L4 nerve root.  She  has weakness of the left quadriceps and the iliopsoas.  The MRI showed that  she might have a recurrence of an L4-5 intra-  or extraforaminal.   CLINICAL IMPRESSION:  L4-5 herniated disk with intra- or extraforaminal  component.    RECOMMENDATIONS:  The patient is being admitted for surgery.  The procedure  will be a left L4-5 diskectomy using the first intraforaminal approach and  later on the extraforaminal if needed.  She knows all the risks such as  infection, CSF leak, persistent pain, paralysis, damage to the vessel of the  abdomen, need for the surgery which include fusion.  Hilda Lias, M.D.    EB/MEDQ  D:  03/25/2003  T:  03/25/2003  Job:  [id number]

## 2010-11-02 NOTE — Consult Note (Signed)
NAME:  Melinda Sanford, Melinda Sanford                         ACCOUNT NO.:  [id number]   MEDICAL RECORD NO.:  [id number]                   PATIENT TYPE:  INP   LOCATION:  3024                                 FACILITY:  [facility]   PHYSICIAN:  Gustavus Messing. Orlin Hilding, M.D.          DATE OF BIRTH:  May 01, 1946   DATE OF CONSULTATION:  04/28/2002  DATE OF DISCHARGE:                                   CONSULTATION   CHIEF COMPLAINT:  Left-sided numbness and possibly some weakness.   HISTORY OF PRESENT ILLNESS:  The patient is a 65 year old right-handed black  woman with a history of hypertension.  There has been a lot of stress in her  life lately.  Several family members have died and several others are ill.  She was at a funeral this morning.  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  She said she felt  confused, did not know where she was, and then had left periorbital pain  followed by numbness of the face and middle finger on the left hand.  She  says there is no problems with her vision, speech, or swallowing.  Apparently, some coworkers felt that she just did not look right.  The  patient cannot be more specific to me.   REVIEW OF SYMPTOMS:  She has review of systems positive for some atypical  chest pain and the headaches.   PAST MEDICAL HISTORY:  Significant for hypertension, headache, insomnia,  mitral valve prolapse.  She has had a hysterectomy.  History of cervical  cancer.  Bladder tack.  TMJ.  Environmental allergies and depression.   MEDICATIONS:  Norvasc, hydrochlorothiazide, Flonase, Ativan, Estrace, and  multivitamins.   ALLERGIES:  Multiple intolerances including PENICILLIN, VERAPAMIL, DARVOCET,  ATENOLOL, CODEINE, ERYTHROMYCIN, ZYRTEC, MIDRIN, SEPTRA, and DYAZIDE.   SOCIAL HISTORY:  She is married.  No cigarettes use or alcohol use.  She  works at the school  and also as a Optician, dispensing.  As noted, there is a quite bit  of stress in her life right now with several family  members dying and  multiple family members ill.   PHYSICAL EXAMINATION:  VITAL SIGNS:  Temperature 98.4, blood pressure  134/82, heart rate 70, respirations 18.  Saturations 97%.  HEENT:  Normocephalic and atraumatic.  NECK:  Supple without bruits.  HEART:  Regular rate and rhythm.  LUNGS:  Clear to auscultation.  EXTREMITIES:  Without edema.  NEUROLOGICAL:  She is awake, alert, oriented fully with normal language.  Cranial nerves show pupils are equal and reactive.  Disk margins are not  visualized.  Extraocular movements are intact without any dysconjugate gaze  or ophthalmoplegia.  She has full visual fields.  Facial sensation is now  normal.  Hearing is intact.  Facial motor activity is  normal.  I see no  droop or asymmetry.  Spontaneous smiling and laughing is symmetric.  The  face is symmetric when she speaks and symmetric at rest.  In asking her to  voluntarily smile, puff out her cheeks, etc., she does fine.  Palate is  symmetric and tongue is midline.  She has a good shoulder shrug.  On motor  exam, she has normal station and gait.  She can heel walk and toe walk.  She  has normal bulk, tone, and strength with the exception of some give way  weakness of the left biceps and triceps.  There is no drift.  No satellite.  Normal rapid fine movement.  No vesiculations, atrophy, or tremor.  Reflexes  are 1+ and symmetric with downgoing toes to plantar stimulation.  Coordination with finger-to-nose in rapid alternating movement, heel-to-  shin, and tandem gait are normal.  Sensory exam is variable on the left and  middle finger.  She reports sometimes that it is decreased sensation and  other times that it is normal.   LABORATORY DATA:  Labs are essentially normal except for a potassium of 2.9.  CT elsewhere reportedly was negative.   IMPRESSION:  Left transient numbness  with questionable eye movement  abnormalities earlier.  Her exam is normal now except for some give way.  This could be a transient ischemic attack  versus possibly a conversion  disorder.  Apparently, there has been some history of that.   RECOMMENDATIONS:  Agree with your planned workup.  MRI with dobutamine  imaging would be very helpful in determining if stroke has occurred.  I  agree with aspirin.                                               Catherine A. Orlin Hilding, M.D.    CAW/MEDQ  D:  04/28/2002  T:  04/29/2002  Job:  [id number]

## 2010-11-02 NOTE — Op Note (Signed)
[place]  Patient:    Melinda Sanford, Melinda Sanford                        MRN: [id number] Proc. Date: 08/15/99 Adm. Date:  [id number] Attending:  Aundria Mems                           Operative Report  PREOPERATIVE DIAGNOSIS:  Chronically persisting prominent left synechial scar band.  POSTOPERATIVE DIAGNOSIS:  Chronically persisting prominent left synechial scar band.  PROCEDURE:  Excision left nasal synechia and insertion of silastic splint.  SURGEON:  Kathy Breach, M.D.  DESCRIPTION OF PROCEDURE:  The patient under general orotracheal anesthesia, inspection of the nose revealed the patient to have a prominent synechial scar and between the inferior turbinate and the septum and mid nasal area on the left side. This area of septum, turbinate and scar band was infiltrated with 1% Xylocaine nd 1:100,000 epinephrine.  Sharp dissection used to divide the prominent scar band. After it was divided, readily visible now was a nasal septal perforation immediately posterior to the synechial scar band on the septum that could not be seen with the synechial scar band in place.  The prominent remaining tissue was  ablated with suction cauterization, as well as, obtaining complete hemostasis of the excision site.  A 0.020 thickness reinforced silastic sheet was cut to size to insert along the septum spanning well across the area of the synechial excision on the left side.  A smaller piece of silastic was cut to serve as a batten for the through and through mattress suture to stabilize the silastic splints in place n either side of the septum.  Minimal bleeding occurred.  Scant amount of bloody discharge was suctioned from patients oropharynx at the completion of the case.  Patient tolerated the procedure well and was taken to the recovery room in stable general condition. DD:  08/15/99 TD:  08/15/99 Job: [id number]

## 2010-11-02 NOTE — Op Note (Signed)
NAME:  Melinda Sanford, Melinda Sanford NO.:  [id number]   MEDICAL RECORD NO.:  [id number]                   PATIENT TYPE:  INP   LOCATION:  3003                                 FACILITY:  [facility]   PHYSICIAN:  Hilda Lias, M.D.                DATE OF BIRTH:  [birth year]   DATE OF PROCEDURE:  03/25/2003  DATE OF DISCHARGE:                                 OPERATIVE REPORT   PREOPERATIVE DIAGNOSIS:  Recurrent left L4-5 herniated disk.   POSTOPERATIVE DIAGNOSIS:  Recurrent left L4-5 herniated disk.   PROCEDURE:  Left L4-5 diskectomy with intra and extraforaminal approach.   SURGEON:  Hilda Lias, M.D.   ASSISTANT:  Cristi Loron, M.D.   CLINICAL COURSE:  Ms. Ciaravino is a 66 year old female who underwent left L4-  5 extraforaminal diskectomy a year ago using the microscope and the Metrix  system. The patient did well but later on she developed pain down to the  left leg. The patient had surgery on the left knee. She had been getting  worse. MRI showed a possible herniated disk as well as scar tissue. Because  of that and she was not any better despite conservative treatment, the  patient wanted to proceed with surgery. The procedure will be intra and  extraforaminal approach. The risks were explained at the history and  physical.   DESCRIPTION OF PROCEDURE:  The patient was taken to the OR and she was  positioned in a prone manner. The back was prepped with Betadine. An  incision from L4 to L5 was made. The fascia was opened half moon type  incision. Immediately we went to the L4-5 space. This was confirmed by x-  ray. With the help of the microscope, we drilled below the lamina of L4 and  the upper of L5. The patient had quite a bit of scar tissue, lysis was  accomplished with our calcified disk mostly going laterally. The incision  was _____ and through the intraforaminal approach a total gross diskectomy  was done. From there on, we went laterally and up  until we found the  transverse process of L4-5. The patient had a scar from previous surgery.  Lysis was accomplished. At this level, we found that the L4 nerve root was  compromised by a herniated disk but mostly by scar tissue. Lysis was  accomplished. We removed some fragments. At the end, we had good  decompression not only the L4 but also the L5 nerve root. The area was  irrigated. Fentanyl and DepoMedrol were left in the epidural space and the  wound was closed with Vicryl and Steri-Strips.                                               Hilda Lias, M.D.  EB/MEDQ  D:  03/25/2003  T:  03/26/2003  Job:  [id number]

## 2010-11-02 NOTE — Op Note (Signed)
NAME:  BAKER, KOGLER NO.:  [id number]   MEDICAL RECORD NO.:  [id number]                   PATIENT TYPE:  INP   LOCATION:  3001                                 FACILITY:  [facility]   PHYSICIAN:  Hilda Lias, M.D.                DATE OF BIRTH:  [birth year]   DATE OF PROCEDURE:  06/23/2002  DATE OF DISCHARGE:                                 OPERATIVE REPORT   PREOPERATIVE DIAGNOSIS:  Left lumbar 4-5 extraforaminal severe herniated  disk with compromise of the lumbar 4, lumbar 5 nerve roots.   POSTOPERATIVE DIAGNOSIS:  Left lumbar 4-5 extraforaminal severe herniated  disk with compromise of the lumbar 4, lumbar 5 nerve roots.   OPERATION PERFORMED:  Extraforaminal diskectomy at the level of 4-5 using  the microscope and the Metrix system, also the C-arm.   SURGEON:  Hilda Lias, M.D.   ASSISTANT:  Payton Doughty, M.D.   ANESTHESIA:   CLINICAL HISTORY:  The patient was seen in my office yesterday afternoon  because of back pain radiating down to the left leg.  It was difficult to do  an extensive examination because of the amount of pain she was having and it  seemed like she had 2/5 weakness in dorsiflexion of the left foot and 2/5 to  absence of the left quadriceps.  She was complaining of numbness of the left  leg.  This lady two weeks had lysis of adhesions of the peroneal nerve in  the left knee.  Since then she states that she has some numbness.  MRI was  obtained and it was found that she has a large disk at the foramina at 4-5  on the left.  Surgery was advised.   DESCRIPTION OF OPERATION:  The patient was brought to the OR and after  intubation she was positioned atraumatically.  The back was prepped with  Betadine.  Using the C-arm we localized the area of 4-5 on the left side.  Infiltration was done with Xylocaine.  Then about an inch incision was made  through the skin and subcutaneous tissues, and using dilators we were able  to go  straight into the area between the transverse process of L4-5 and the  facets laterally.   We brought the microscope into the area.  Muscle was resected.  Then with  the drill we drilled bone of the lamina about the facet of 4-5 superiorly.  The patient had quite a bit of intertransverse muscle.  Resection was done.  We found the L4 nerve root, which was swollen and lysis was accomplished,  and with retraction we found several fragments compromising the takeoff of  L4.  After that we entered the disk space and we did a total gross  diskectomy.  The patient had some part of the disk underneath the L4 nerve  root right at the level of the ganglia and  dissection  of this fragment was accomplished.  At the end we had a good  decompression along the L4 nerve root.  Having done this the area was  irrigated.  Hemostasis was done with bipolar.  __________ and Depo-Medrol  were left in the intra-articular space and the wound was closed Vicryl and  Steri-strips.                                                 Hilda Lias, M.D.    EB/MEDQ  D:  06/23/2002  T:  06/24/2002  Job:  [id number]

## 2010-11-02 NOTE — Discharge Summary (Signed)
   NAME:  Melinda Sanford, Melinda Sanford NO.:  [id number]   MEDICAL RECORD NO.:  [id number]                   PATIENT TYPE:  INP   LOCATION:  3003                                 FACILITY:  [facility]   PHYSICIAN:  Hilda Lias, M.D.                DATE OF BIRTH:  [birth year]   DATE OF ADMISSION:  03/25/2003  DATE OF DISCHARGE:  03/29/2003                                 DISCHARGE SUMMARY   ADMISSION DIAGNOSES:  Recurrent herniated disk at L4-5 with intraforaminal  and extraforaminal component.   FINAL DIAGNOSES:  Recurrent herniated disk at L4-5 with intraforaminal and  extraforaminal component.   HISTORY OF PRESENT ILLNESS:  The patient was admitted because of back pain  with radiation down to the left leg pedals.  The patient had previous  surgery, and the MRI showed intraforaminal and extraforaminal scar tissue  plus the possibility of a recurrent herniated disk.  Because of the  persistence of the pain she was admitted for surgery. ________ normal.   HOSPITAL COURSE:  The patient was taken to surgery and a left L4-5  discectomy with intraforaminal and extraforaminal approach where  decompression of the L4 and L5 nerve root was done.  After the surgery, the  patient had difficulty urinating, but right now she is doing much better.  She had been seen by physical therapy.  She is ambulating, and she is ready  to go home.  The wound looks fine.   CONDITION ON DISCHARGE:  Improvement.   MEDICATIONS:  1. Percocet.  2. Diazepam.  3. Neurontin.   DIET:  Regular.   ACTIVITY:  She is not to drive for at least two weeks.  She is to be careful  about going up and down stairs.   FOLLOWUP:  I will see her in four weeks in my office.                                                Hilda Lias, M.D.    EB/MEDQ  D:  03/29/2003  T:  03/29/2003  Job:  [id number]

## 2011-01-31 ENCOUNTER — Other Ambulatory Visit: Payer: Self-pay | Admitting: Neurosurgery

## 2011-01-31 ENCOUNTER — Ambulatory Visit
Admission: RE | Admit: 2011-01-31 | Discharge: 2011-01-31 | Disposition: A | Discharge status: Home / Self Care | Payer: Medicare Other | Source: Ambulatory Visit | Attending: Neurosurgery | Admitting: Neurosurgery

## 2011-01-31 DIAGNOSIS — M545 Low back pain: Secondary | ICD-10-CM

## 2011-04-30 ENCOUNTER — Other Ambulatory Visit: Payer: Self-pay | Admitting: Neurosurgery

## 2011-04-30 ENCOUNTER — Ambulatory Visit
Admission: RE | Admit: 2011-04-30 | Discharge: 2011-04-30 | Disposition: A | Discharge status: Home / Self Care | Payer: Medicare Other | Source: Ambulatory Visit | Attending: Neurosurgery | Admitting: Neurosurgery

## 2011-04-30 DIAGNOSIS — M545 Low back pain: Secondary | ICD-10-CM

## 2011-08-01 ENCOUNTER — Other Ambulatory Visit: Payer: Self-pay | Admitting: Neurological Surgery

## 2011-08-01 ENCOUNTER — Ambulatory Visit
Admission: RE | Admit: 2011-08-01 | Discharge: 2011-08-01 | Disposition: A | Discharge status: Home / Self Care | Payer: Medicare Other | Source: Ambulatory Visit | Attending: Neurological Surgery | Admitting: Neurological Surgery

## 2011-08-01 DIAGNOSIS — M47817 Spondylosis without myelopathy or radiculopathy, lumbosacral region: Secondary | ICD-10-CM

## 2011-08-19 ENCOUNTER — Other Ambulatory Visit: Payer: Self-pay | Admitting: Gastroenterology

## 2011-08-20 ENCOUNTER — Ambulatory Visit
Admission: RE | Admit: 2011-08-20 | Discharge: 2011-08-20 | Disposition: A | Discharge status: Home / Self Care | Payer: Medicare Other | Source: Ambulatory Visit | Attending: Gastroenterology | Admitting: Gastroenterology

## 2011-12-05 ENCOUNTER — Other Ambulatory Visit: Payer: Self-pay | Admitting: Obstetrics and Gynecology

## 2012-04-28 ENCOUNTER — Other Ambulatory Visit: Payer: Self-pay | Admitting: Obstetrics and Gynecology

## 2012-04-28 DIAGNOSIS — Z780 Asymptomatic menopausal state: Secondary | ICD-10-CM

## 2012-04-28 DIAGNOSIS — Z1231 Encounter for screening mammogram for malignant neoplasm of breast: Secondary | ICD-10-CM

## 2012-06-15 ENCOUNTER — Ambulatory Visit
Admission: RE | Admit: 2012-06-15 | Discharge: 2012-06-15 | Disposition: A | Discharge status: Home / Self Care | Payer: Medicare Other | Source: Ambulatory Visit | Attending: Obstetrics and Gynecology | Admitting: Obstetrics and Gynecology

## 2012-06-15 DIAGNOSIS — Z1231 Encounter for screening mammogram for malignant neoplasm of breast: Secondary | ICD-10-CM

## 2012-06-15 DIAGNOSIS — Z780 Asymptomatic menopausal state: Secondary | ICD-10-CM

## 2012-12-31 ENCOUNTER — Other Ambulatory Visit: Payer: Self-pay | Admitting: Neurosurgery

## 2012-12-31 DIAGNOSIS — M5126 Other intervertebral disc displacement, lumbar region: Secondary | ICD-10-CM

## 2013-01-07 ENCOUNTER — Ambulatory Visit
Admission: RE | Admit: 2013-01-07 | Discharge: 2013-01-07 | Disposition: A | Discharge status: Home / Self Care | Payer: Medicare Other | Source: Ambulatory Visit | Attending: Neurosurgery | Admitting: Neurosurgery

## 2013-01-07 DIAGNOSIS — M5126 Other intervertebral disc displacement, lumbar region: Secondary | ICD-10-CM

## 2013-01-07 MED ORDER — GADOBENATE DIMEGLUMINE 529 MG/ML IV SOLN
15.0000 mL | Freq: Once | INTRAVENOUS | Status: AC | PRN
  Administered 2013-01-07: 15 mL via INTRAVENOUS

## 2013-04-02 ENCOUNTER — Encounter: Payer: Self-pay | Admitting: Interventional Cardiology

## 2013-04-02 ENCOUNTER — Ambulatory Visit (INDEPENDENT_AMBULATORY_CARE_PROVIDER_SITE_OTHER): Payer: Medicare Other | Admitting: Interventional Cardiology

## 2013-04-02 VITALS — BP 139/80 | HR 80 | Ht 64.0 in | Wt 196.0 lb

## 2013-04-02 DIAGNOSIS — Z0000 Encounter for general adult medical examination without abnormal findings: Secondary | ICD-10-CM

## 2013-04-02 DIAGNOSIS — I10 Essential (primary) hypertension: Secondary | ICD-10-CM

## 2013-04-02 DIAGNOSIS — I209 Angina pectoris, unspecified: Secondary | ICD-10-CM

## 2013-04-02 DIAGNOSIS — R0789 Other chest pain: Secondary | ICD-10-CM

## 2013-04-02 NOTE — Progress Notes (Signed)
Patient ID: Melinda Sanford, female   DOB: Oct 25, 1945, 67 y.o.   MRN: [id number]    [address] [place], [place]  [address] Phone: [phone] Fax:  [phone]  Date:  04/02/2013   ID:  Melinda Sanford, DOB [birth year], MRN [id number]  PCP:  Pearla Dubonnet, MD      History of Present Illness: Melinda Sanford is a 67 y.o. female who has had exertional chest tightness. She has not had a cardiac workup in years. She did have a 2-D echocardiogram in 2010 that revealed an ejection fraction of 65% and no wall motion abnormalities. Over the past couple months she has not been able to be able to walk more than a short distance without getting some chest tightness and shortness of breath and wheezing. She thought it might be asthma but she also has been having some chest pain. She generally feels quite fatigued overall.  She can walk for about a few minutes before she has to stop because of the pressure and discomfort.  She had t sit down and one of her church members had to get her water.      Wt Readings from Last 3 Encounters:  04/02/13 196 lb (88.905 kg)     Past Medical History  Diagnosis Date  . Hypertension     Current Outpatient Prescriptions  Medication Sig Dispense Refill  . budesonide-formoterol (SYMBICORT) 160-4.5 MCG/ACT inhaler Inhale 2 puffs into the lungs 2 (two) times daily. As needed      . cholecalciferol (VITAMIN D) 1000 UNITS tablet Take 1,000 Units by mouth daily.      . citalopram (CELEXA) 20 MG tablet Take 20 mg by mouth daily.      . cyclobenzaprine (FLEXERIL) 10 MG tablet Take 10 mg by mouth 3 (three) times daily as needed for muscle spasms.      Marland Kitchen estradiol (ESTRACE) 1 MG tablet Take 1 mg by mouth daily. 1 tab daily      . fentaNYL (DURAGESIC - DOSED MCG/HR) 12 MCG/HR Place 1 patch onto the skin every 3 (three) days.      Marland Kitchen gabapentin (NEURONTIN) 100 MG capsule Take 100 mg by mouth 3 (three) times daily.      . Glucosamine-MSM-Hyaluronic Acd  (JOINT HEALTH PO) Take by mouth. OTC 1000 take two tabs daily      . omeprazole (PRILOSEC) 40 MG capsule Take 40 mg by mouth daily.      . promethazine (PHENERGAN) 25 MG tablet Take by mouth. As neded      . zonisamide (ZONEGRAN) 25 MG capsule 25 mg. Take 3 pills at bedtime       No current facility-administered medications for this visit.    Allergies:    Allergies  Allergen Reactions  . Asa [Aspirin]   . Atenolol     headache  . Codeine   . Doxycycline     Gi upset stomach  . Dyazide [Hydrochlorothiazide W-Triamterene]   . Phenergan [Promethazine]   . Septra [Sulfamethoxazole-Tmp Ds]     headaches and dizziness  . Verapamil     headache    Social History:  The patient  reports that she has never smoked. She does not have any smokeless tobacco history on file. She reports that she does not drink alcohol or use illicit drugs.   Family History:  The patient's family history is not on file.   ROS:  Please see the history of present illness.  No nausea,  vomiting.  No fevers, chills.  No focal weakness.  No dysuria. CP and SHOB as noted above.   All other systems reviewed and negative.   PHYSICAL EXAM: VS:  BP 139/80  Pulse 80  Ht 5\' 4"  (1.626 m)  Wt 196 lb (88.905 kg)  BMI 33.63 kg/m2 Well nourished, well developed, in no acute distress HEENT: normal Neck: no JVD, no carotid bruits Cardiac:  normal S1, S2; RRR;  Lungs:  clear to auscultation bilaterally, no wheezing, rhonchi or rales Abd: soft, nontender, no hepatomegaly Ext: no edema Skin: warm and dry Neuro:   no focal abnormalities noted  EKG:  NSR, 1 mm ST depressions in V5 V6.    ASSESSMENT AND PLAN:  1. Angina:  Discussed options for ischemia evaluation including stress test and cardiac cath.  She would prefer stress testing.  Exercise has been limited by back pain for several years.  She cannot walk the treadmill due to back pain and prior surgery.  Will plan for lexiscan cardiolite.  Sx concerning or angina.   COuld add Imdur.  She did not tolerate atenolol in the past.  Of note, aspirin reaction is "stomach burning" so this coud be used f if necessary.  Her echocardiogram showed normal left ventricular function.   2. HTN: COntinue aggressive control of BPwith lifestyle modifications.  She avoid excess salt.   3. Flu shot given.  Signed, Fredric Mare, MD, [facility] 04/02/2013 2:48 PM

## 2013-04-02 NOTE — Patient Instructions (Signed)
Your physician has requested that you have a lexiscan myoview. For further information please visit [email or web address]. Please follow instruction sheet, as given.  Your physician recommends that you continue on your current medications as directed. Please refer to the Current Medication list given to you today.  

## 2013-04-20 ENCOUNTER — Encounter: Payer: Self-pay | Admitting: Interventional Cardiology

## 2013-04-20 ENCOUNTER — Encounter: Payer: Self-pay | Admitting: *Deleted

## 2013-04-26 ENCOUNTER — Ambulatory Visit (HOSPITAL_COMMUNITY): Payer: Medicare Other | Attending: Cardiology | Admitting: Radiology

## 2013-04-26 VITALS — BP 132/83 | Ht 64.0 in | Wt 194.0 lb

## 2013-04-26 DIAGNOSIS — R9431 Abnormal electrocardiogram [ECG] [EKG]: Secondary | ICD-10-CM | POA: Insufficient documentation

## 2013-04-26 DIAGNOSIS — R0989 Other specified symptoms and signs involving the circulatory and respiratory systems: Secondary | ICD-10-CM | POA: Insufficient documentation

## 2013-04-26 DIAGNOSIS — R0609 Other forms of dyspnea: Secondary | ICD-10-CM | POA: Insufficient documentation

## 2013-04-26 DIAGNOSIS — R5381 Other malaise: Secondary | ICD-10-CM | POA: Insufficient documentation

## 2013-04-26 DIAGNOSIS — R079 Chest pain, unspecified: Secondary | ICD-10-CM

## 2013-04-26 DIAGNOSIS — I209 Angina pectoris, unspecified: Secondary | ICD-10-CM

## 2013-04-26 DIAGNOSIS — R0602 Shortness of breath: Secondary | ICD-10-CM

## 2013-04-26 DIAGNOSIS — Z8249 Family history of ischemic heart disease and other diseases of the circulatory system: Secondary | ICD-10-CM | POA: Insufficient documentation

## 2013-04-26 MED ORDER — TECHNETIUM TC 99M SESTAMIBI GENERIC - CARDIOLITE
30.0000 | Freq: Once | INTRAVENOUS | Status: AC | PRN
  Administered 2013-04-26: 30 via INTRAVENOUS

## 2013-04-26 MED ORDER — REGADENOSON 0.4 MG/5ML IV SOLN
0.4000 mg | Freq: Once | INTRAVENOUS | Status: AC
  Administered 2013-04-26: 0.4 mg via INTRAVENOUS

## 2013-04-26 MED ORDER — TECHNETIUM TC 99M SESTAMIBI GENERIC - CARDIOLITE
10.0000 | Freq: Once | INTRAVENOUS | Status: AC | PRN
  Administered 2013-04-26: 10 via INTRAVENOUS

## 2013-04-26 NOTE — Progress Notes (Signed)
[facility] SITE 3 NUCLEAR MED [address] [place], [place] [address] [phone]    Cardiology Nuclear Med Study  Melinda Sanford is a 67 y.o. female     MRN : [id number]     DOB: 1945/07/13  Procedure Date: 04/26/2013  Nuclear Med Background Indication for Stress Test:  Evaluation for Ischemia and Abnormal EKG:ST changes History:  ECHO: EF: 65% Cardiac Risk Factors: Family History - CAD  Symptoms:  Chest Pain, DOE, Fatigue and SOB   Nuclear Pre-Procedure Caffeine/Decaff Intake:  8:00pm NPO After: 8:00pm   Lungs:  clear O2 Sat: 97% on room air. IV 0.9% NS with Angio Cath:  22g  IV Site: R Hand  IV Started by:  Cathlyn Parsons, RN  Chest Size (in):  34 Cup Size: C  Height: 5\' 4"  (1.626 m)  Weight:  194 lb (87.998 kg)  BMI:  Body mass index is 33.28 kg/(m^2). Tech Comments:  n/a    Nuclear Med Study 1 or 2 day study: 1 day  Stress Test Type:  Lexiscan  Reading MD: Lance Muss, MD  Order Authorizing Provider:  Floydene Flock  Resting Radionuclide: Technetium 61m Sestamibi  Resting Radionuclide Dose: 11.0 mCi   Stress Radionuclide:  Technetium 74m Sestamibi  Stress Radionuclide Dose: 33.0 mCi           Stress Protocol Rest HR: 65 Stress HR: 84  Rest BP: 132/83 Stress BP: 140/90  Exercise Time (min): n/a METS: n/a   Predicted Max HR: 156 bpm % Max HR: 53.85 bpm Rate Pressure Product: 69629   Dose of Adenosine (mg):  n/a Dose of Lexiscan: 0.4 mg  Dose of Atropine (mg): n/a Dose of Dobutamine: n/a mcg/kg/min (at max HR)  Stress Test Technologist: Milana Na, EMT-P  Nuclear Technologist:  Harlow Asa, CNMT     Rest Procedure:  Myocardial perfusion imaging was performed at rest 45 minutes following the intravenous administration of Technetium 24m Sestamibi. Rest ECG: NSR with non-specific ST-T wave changes  Stress Procedure:  The patient received IV Lexiscan 0.4 mg over 15-seconds.  Technetium 57m Sestamibi injected at 30-seconds.  This patient felt shaky with the Lexiscan injection.Quantitative spect images were obtained after a 45 minute delay. Stress ECG: No significant change from baseline ECG  QPS Raw Data Images:  Mild breast attenuation; normal left ventricular size. Stress Images:  There is decreased uptake in the apex. Rest Images:  Normal homogeneous uptake in all areas of the myocardium. Subtraction (SDS):  No evidence of ischemia. Transient Ischemic Dilatation (Normal <1.22):  1.05 Lung/Heart Ratio (Normal <0.45):  0.26  Quantitative Gated Spect Images QGS EDV:  62 ml QGS ESV:  11 ml  Impression Exercise Capacity:  Lexiscan with no exercise. BP Response:  Normal blood pressure response. Clinical Symptoms:  shaky deal ECG Impression:  No significant ECG changes with Lexiscan. Comparison with Prior Nuclear Study: No previous nuclear study performed  Overall Impression:  Normal stress nuclear study.  LV Ejection Fraction: 82%.  LV Wall Motion:  Normal Wall Motion  Corky Crafts., MD, [facility]

## 2013-05-11 ENCOUNTER — Other Ambulatory Visit: Payer: Self-pay | Admitting: Internal Medicine

## 2013-05-11 ENCOUNTER — Ambulatory Visit
Admission: RE | Admit: 2013-05-11 | Discharge: 2013-05-11 | Disposition: A | Discharge status: Home / Self Care | Payer: Medicare Other | Source: Ambulatory Visit | Attending: Internal Medicine | Admitting: Internal Medicine

## 2013-05-11 DIAGNOSIS — J019 Acute sinusitis, unspecified: Secondary | ICD-10-CM

## 2013-09-07 ENCOUNTER — Other Ambulatory Visit: Payer: Self-pay | Admitting: Internal Medicine

## 2013-09-07 DIAGNOSIS — R141 Gas pain: Secondary | ICD-10-CM

## 2013-09-07 DIAGNOSIS — R143 Flatulence: Principal | ICD-10-CM

## 2013-09-07 DIAGNOSIS — R142 Eructation: Principal | ICD-10-CM

## 2013-09-08 ENCOUNTER — Ambulatory Visit
Admission: RE | Admit: 2013-09-08 | Discharge: 2013-09-08 | Disposition: A | Discharge status: Home / Self Care | Payer: Medicare Other | Source: Ambulatory Visit | Attending: Internal Medicine | Admitting: Internal Medicine

## 2013-09-08 DIAGNOSIS — R143 Flatulence: Principal | ICD-10-CM

## 2013-09-08 DIAGNOSIS — R142 Eructation: Principal | ICD-10-CM

## 2013-09-08 DIAGNOSIS — R141 Gas pain: Secondary | ICD-10-CM

## 2013-09-16 ENCOUNTER — Ambulatory Visit
Admission: RE | Admit: 2013-09-16 | Discharge: 2013-09-16 | Disposition: A | Discharge status: Home / Self Care | Payer: Medicare Other | Source: Ambulatory Visit | Attending: Nurse Practitioner | Admitting: Nurse Practitioner

## 2013-09-16 ENCOUNTER — Other Ambulatory Visit: Payer: Self-pay | Admitting: Nurse Practitioner

## 2013-09-16 DIAGNOSIS — M25569 Pain in unspecified knee: Secondary | ICD-10-CM

## 2013-11-12 ENCOUNTER — Other Ambulatory Visit (HOSPITAL_COMMUNITY): Payer: Self-pay | Admitting: Neurosurgery

## 2013-11-12 ENCOUNTER — Ambulatory Visit (HOSPITAL_COMMUNITY)
Admission: RE | Admit: 2013-11-12 | Discharge: 2013-11-12 | Disposition: A | Discharge status: Home / Self Care | Payer: Medicare Other | Source: Ambulatory Visit | Attending: Vascular Surgery | Admitting: Vascular Surgery

## 2013-11-12 DIAGNOSIS — M79609 Pain in unspecified limb: Secondary | ICD-10-CM

## 2014-04-06 ENCOUNTER — Other Ambulatory Visit: Payer: Self-pay | Admitting: Internal Medicine

## 2014-04-06 DIAGNOSIS — Z1239 Encounter for other screening for malignant neoplasm of breast: Secondary | ICD-10-CM

## 2014-04-14 ENCOUNTER — Ambulatory Visit
Admission: RE | Admit: 2014-04-14 | Discharge: 2014-04-14 | Disposition: A | Discharge status: Home / Self Care | Payer: Medicare Other | Source: Ambulatory Visit | Attending: Internal Medicine | Admitting: Internal Medicine

## 2014-04-14 ENCOUNTER — Encounter (INDEPENDENT_AMBULATORY_CARE_PROVIDER_SITE_OTHER): Payer: Self-pay

## 2014-04-14 DIAGNOSIS — Z1239 Encounter for other screening for malignant neoplasm of breast: Secondary | ICD-10-CM

## 2017-11-05 ENCOUNTER — Other Ambulatory Visit: Payer: Self-pay | Admitting: Internal Medicine

## 2017-11-05 ENCOUNTER — Ambulatory Visit
Admission: RE | Admit: 2017-11-05 | Discharge: 2017-11-05 | Disposition: A | Discharge status: Home / Self Care | Payer: Medicare Other | Source: Ambulatory Visit | Attending: Internal Medicine | Admitting: Internal Medicine

## 2017-11-05 DIAGNOSIS — R0600 Dyspnea, unspecified: Secondary | ICD-10-CM

## 2018-01-15 HISTORY — PX: KNEE SURGERY: SHX244

## 2018-04-09 ENCOUNTER — Other Ambulatory Visit: Payer: Self-pay

## 2018-04-09 ENCOUNTER — Emergency Department (HOSPITAL_COMMUNITY): Payer: Medicare Other

## 2018-04-09 ENCOUNTER — Observation Stay (HOSPITAL_COMMUNITY): Payer: Medicare Other

## 2018-04-09 ENCOUNTER — Encounter (HOSPITAL_COMMUNITY): Payer: Self-pay | Admitting: Emergency Medicine

## 2018-04-09 ENCOUNTER — Observation Stay (HOSPITAL_COMMUNITY)
Admission: EM | Admit: 2018-04-09 | Discharge: 2018-04-10 | Disposition: A | Discharge status: Home / Self Care | Payer: Medicare Other | Attending: Internal Medicine | Admitting: Internal Medicine

## 2018-04-09 DIAGNOSIS — I119 Hypertensive heart disease without heart failure: Secondary | ICD-10-CM | POA: Diagnosis not present

## 2018-04-09 DIAGNOSIS — Z886 Allergy status to analgesic agent status: Secondary | ICD-10-CM | POA: Insufficient documentation

## 2018-04-09 DIAGNOSIS — R600 Localized edema: Secondary | ICD-10-CM | POA: Insufficient documentation

## 2018-04-09 DIAGNOSIS — I252 Old myocardial infarction: Secondary | ICD-10-CM | POA: Insufficient documentation

## 2018-04-09 DIAGNOSIS — R7989 Other specified abnormal findings of blood chemistry: Secondary | ICD-10-CM | POA: Insufficient documentation

## 2018-04-09 DIAGNOSIS — Z885 Allergy status to narcotic agent status: Secondary | ICD-10-CM | POA: Diagnosis not present

## 2018-04-09 DIAGNOSIS — R0789 Other chest pain: Secondary | ICD-10-CM | POA: Diagnosis not present

## 2018-04-09 DIAGNOSIS — R7303 Prediabetes: Secondary | ICD-10-CM | POA: Insufficient documentation

## 2018-04-09 DIAGNOSIS — R079 Chest pain, unspecified: Secondary | ICD-10-CM | POA: Diagnosis present

## 2018-04-09 DIAGNOSIS — K219 Gastro-esophageal reflux disease without esophagitis: Secondary | ICD-10-CM | POA: Insufficient documentation

## 2018-04-09 DIAGNOSIS — Z888 Allergy status to other drugs, medicaments and biological substances status: Secondary | ICD-10-CM | POA: Diagnosis not present

## 2018-04-09 DIAGNOSIS — L500 Allergic urticaria: Secondary | ICD-10-CM

## 2018-04-09 DIAGNOSIS — J45909 Unspecified asthma, uncomplicated: Secondary | ICD-10-CM | POA: Insufficient documentation

## 2018-04-09 DIAGNOSIS — Z881 Allergy status to other antibiotic agents status: Secondary | ICD-10-CM | POA: Diagnosis not present

## 2018-04-09 DIAGNOSIS — M170 Bilateral primary osteoarthritis of knee: Secondary | ICD-10-CM | POA: Insufficient documentation

## 2018-04-09 DIAGNOSIS — I10 Essential (primary) hypertension: Secondary | ICD-10-CM | POA: Diagnosis present

## 2018-04-09 DIAGNOSIS — E876 Hypokalemia: Secondary | ICD-10-CM | POA: Diagnosis not present

## 2018-04-09 DIAGNOSIS — Z79899 Other long term (current) drug therapy: Secondary | ICD-10-CM | POA: Diagnosis not present

## 2018-04-09 DIAGNOSIS — Z882 Allergy status to sulfonamides status: Secondary | ICD-10-CM | POA: Diagnosis not present

## 2018-04-09 DIAGNOSIS — I088 Other rheumatic multiple valve diseases: Secondary | ICD-10-CM | POA: Insufficient documentation

## 2018-04-09 DIAGNOSIS — F419 Anxiety disorder, unspecified: Secondary | ICD-10-CM | POA: Diagnosis not present

## 2018-04-09 DIAGNOSIS — J90 Pleural effusion, not elsewhere classified: Secondary | ICD-10-CM | POA: Diagnosis not present

## 2018-04-09 HISTORY — DX: Prediabetes: R73.03

## 2018-04-09 LAB — COMPREHENSIVE METABOLIC PANEL
ALBUMIN: 4 g/dL (ref 3.5–5.0)
ALT: 10 U/L (ref 0–44)
ANION GAP: 9 (ref 5–15)
AST: 18 U/L (ref 15–41)
Alkaline Phosphatase: 56 U/L (ref 38–126)
BUN: 14 mg/dL (ref 8–23)
CHLORIDE: 106 mmol/L (ref 98–111)
CO2: 25 mmol/L (ref 22–32)
Calcium: 9.1 mg/dL (ref 8.9–10.3)
Creatinine, Ser: 0.9 mg/dL (ref 0.44–1.00)
GFR calc Af Amer: >60 mL/min (ref >60)
GFR calc non Af Amer: >60 mL/min (ref >60)
GLUCOSE: 121 mg/dL — AB (ref 70–99)
POTASSIUM: 3.5 mmol/L (ref 3.5–5.1)
SODIUM: 140 mmol/L (ref 135–145)
TOTAL PROTEIN: 7.1 g/dL (ref 6.5–8.1)
Total Bilirubin: 0.5 mg/dL (ref 0.3–1.2)

## 2018-04-09 LAB — CBC WITH DIFFERENTIAL/PLATELET
Abs Immature Granulocytes: 0.02 10*3/uL (ref 0.00–0.07)
Basophils Absolute: 0 10*3/uL (ref 0.0–0.1)
Basophils Relative: 0 %
EOS ABS: 0 10*3/uL (ref 0.0–0.5)
EOS PCT: 0 %
HCT: 49.2 % — ABNORMAL HIGH (ref 36.0–46.0)
Hemoglobin: 15 g/dL (ref 12.0–15.0)
IMMATURE GRANULOCYTES: 0 %
LYMPHS ABS: 2 10*3/uL (ref 0.7–4.0)
Lymphocytes Relative: 25 %
MCH: 24.6 pg — ABNORMAL LOW (ref 26.0–34.0)
MCHC: 30.5 g/dL (ref 30.0–36.0)
MCV: 80.8 fL (ref 80.0–100.0)
Monocytes Absolute: 0.5 10*3/uL (ref 0.1–1.0)
Monocytes Relative: 6 %
NEUTROS PCT: 69 %
NRBC: 0 % (ref 0.0–0.2)
Neutro Abs: 5.4 10*3/uL (ref 1.7–7.7)
PLATELETS: 341 10*3/uL (ref 150–400)
RBC: 6.09 MIL/uL — AB (ref 3.87–5.11)
RDW: 14.8 % (ref 11.5–15.5)
WBC: 7.9 10*3/uL (ref 4.0–10.5)

## 2018-04-09 LAB — I-STAT TROPONIN, ED: Troponin i, poc: 0 ng/mL (ref 0.00–0.08)

## 2018-04-09 LAB — TROPONIN I
Troponin I: 0.04 ng/mL (ref <0.03)
Troponin I: <0.03 ng/mL (ref <0.03)

## 2018-04-09 LAB — D-DIMER, QUANTITATIVE (NOT AT ARMC): D DIMER QUANT: 3.29 ug{FEU}/mL — AB (ref 0.00–0.50)

## 2018-04-09 MED ORDER — FLUTICASONE FUROATE-VILANTEROL 200-25 MCG/INH IN AEPB
1.0000 | INHALATION_SPRAY | Freq: Every day | RESPIRATORY_TRACT | Status: DC
  Administered 2018-04-09 – 2018-04-10 (×2): 1 via RESPIRATORY_TRACT
  Filled 2018-04-09: qty 28

## 2018-04-09 MED ORDER — ACETAMINOPHEN 325 MG PO TABS
650.0000 mg | ORAL_TABLET | ORAL | Status: DC | PRN
  Administered 2018-04-09: 650 mg via ORAL
  Filled 2018-04-09: qty 2

## 2018-04-09 MED ORDER — CITALOPRAM HYDROBROMIDE 20 MG PO TABS
20.0000 mg | ORAL_TABLET | Freq: Every day | ORAL | Status: DC
  Administered 2018-04-09 – 2018-04-10 (×2): 20 mg via ORAL
  Filled 2018-04-09 (×2): qty 1

## 2018-04-09 MED ORDER — CYCLOBENZAPRINE HCL 10 MG PO TABS: 10.0000 mg | ORAL_TABLET | Freq: Three times a day (TID) | ORAL | Status: DC | PRN

## 2018-04-09 MED ORDER — ONDANSETRON HCL 4 MG/2ML IJ SOLN
4.0000 mg | Freq: Four times a day (QID) | INTRAMUSCULAR | Status: DC | PRN
  Administered 2018-04-09 (×2): 4 mg via INTRAVENOUS
  Filled 2018-04-09 (×2): qty 2

## 2018-04-09 MED ORDER — IOPAMIDOL (ISOVUE-370) INJECTION 76%
100.0000 mL | Freq: Once | INTRAVENOUS | Status: AC | PRN
  Administered 2018-04-09: 100 mL via INTRAVENOUS

## 2018-04-09 MED ORDER — IOPAMIDOL (ISOVUE-370) INJECTION 76%
INTRAVENOUS | Status: AC
  Filled 2018-04-09: qty 100

## 2018-04-09 MED ORDER — PANTOPRAZOLE SODIUM 40 MG PO TBEC
40.0000 mg | DELAYED_RELEASE_TABLET | Freq: Every day | ORAL | Status: DC
  Administered 2018-04-09 – 2018-04-10 (×2): 40 mg via ORAL
  Filled 2018-04-09 (×3): qty 1

## 2018-04-09 MED ORDER — HYDROCHLOROTHIAZIDE 25 MG PO TABS
12.5000 mg | ORAL_TABLET | Freq: Every day | ORAL | Status: DC
  Administered 2018-04-09 – 2018-04-10 (×2): 12.5 mg via ORAL
  Filled 2018-04-09 (×2): qty 1

## 2018-04-09 MED ORDER — VITAMIN D 1000 UNITS PO TABS
1000.0000 [IU] | ORAL_TABLET | Freq: Every day | ORAL | Status: DC
  Administered 2018-04-09 – 2018-04-10 (×2): 1000 [IU] via ORAL
  Filled 2018-04-09 (×2): qty 1

## 2018-04-09 MED ORDER — FENTANYL 12 MCG/HR TD PT72
12.5000 ug | MEDICATED_PATCH | TRANSDERMAL | Status: DC
  Administered 2018-04-09: 12.5 ug via TRANSDERMAL
  Filled 2018-04-09: qty 1

## 2018-04-09 MED ORDER — ZONISAMIDE 25 MG PO CAPS
75.0000 mg | ORAL_CAPSULE | Freq: Every day | ORAL | Status: DC
  Administered 2018-04-09: 75 mg via ORAL
  Filled 2018-04-09 (×3): qty 3

## 2018-04-09 MED ORDER — HEPARIN SODIUM (PORCINE) 5000 UNIT/ML IJ SOLN
5000.0000 [IU] | Freq: Three times a day (TID) | INTRAMUSCULAR | Status: DC
  Administered 2018-04-09 – 2018-04-10 (×4): 5000 [IU] via SUBCUTANEOUS
  Filled 2018-04-09 (×4): qty 1

## 2018-04-09 NOTE — Consult Note (Addendum)
Cardiology Consult    Patient ID: CHRISLYNN MOSELY MRN: [id number], DOB/AGE: 01-19-1946   Admit date: 04/09/2018 Date of Consult: 04/09/2018  Primary Physician: Marden Noble, MD Primary Cardiologist: No primary care provider on file. Requesting Provider: Huey Bienenstock, MD  Patient Profile    Melinda Sanford is a 72 y.o. female with a history of hypertension and GERD, who is being seen today for the evaluation of chest pain at the request of Dr. Randol Kern.  History of Present Illness    Patient previously seen by Dr. Eldridge Dace in 2014 for exertional chest tightness with associated shortness of breath concerning for angina. Lexiscan myoview at that time showed no ischemia. Patient states she has been doing well since then and was in her usual state of health until last night when she developed chest pain.  Onset of pain occurred around 9pm while patient was resting. She describes the pain as a pressure and states it felt like someone was "laying on her chest." She notes radiation to her back and shoulder blades. She is unable to rank the pain but states it was so severe that she was unable to get comfortable and she had trouble getting to sleep. She initially thought it was indigestion so she took Tums and baking-soda with water with no relief. Patient reports feeling lightheaded/dizzy yesterday prior to the onset of chest pain but denies any associated diaphoresis, nausea/vomiting, or palpitations. The chest pain persisted into this morning. She called her PCP who told her to come into the office immediately. PCP got an EKG, gave the patient 3 Aspirin, and then had the patient transported to the ED via EMS.   Patient also reports chronic shortness of breath exertion that she feels like has been worsening over the last year. She also notes some orthopnea and PND. She notes some lower extremity edema for which she takes HCTZ. Patient denies any exertional chest pain since 2014. However, she  does report some intermittent "intense, hot" chest pain that takes her breath away. This intermittent pain only lasts for a view seconds and then resolves when she sits really still. She states this has been going on for a while but was unable to give me an exact time frame.  Upon arrival to the ED, EKG showed no acute ST changes from prior tracings. I-stat troponin negative. Chest x-ray showed no acute findings. WBC 7.9, Hgb 15.0, Plts 341. Na 140, K 3.5, Glucose 121, SCr 0.90. D-dimer elevated at 3.29. CTA chest pending.   Currently, patient denies any chest pain or shortness of breath. She did develop sudden onset of nausea while I was examining her.   Patient denies any tobacco, alcohol, or drug use.   Of note, patient woke up with hives on her chest and back this morning. She reports eating a pumpkin pudding recently that she has never had before. She also notes being allergic to Tide but states she recently washed her clothes in [employer].   Past Medical History   Past Medical History:  Diagnosis Date  . Allergic rhinitis   . Anxiety   . Asthma   . Brachial neuritis or radiculitis NOS   . DJD (degenerative joint disease)    of the knees, right greater than left - Dr. Hardin Negus, May, 2011  . Dyspnea   . Fatigue   . GERD (gastroesophageal reflux disease)   . Hypertension   . Hyposmolality and/or hyponatremia   . Iron deficiency anemia, unspecified   .  Pain in joint   . Pre-diabetes     Past Surgical History:  Procedure Laterality Date  . back surgery - Dr. Jeral Fruit    . KNEE SURGERY  01/2018  . lumbar surgery, Dr. Donalee Citrin, 2012    . right knee arthroscopy - Dr. Dion Saucier 2010/2011    . TOTAL ABDOMINAL HYSTERECTOMY W/ BILATERAL SALPINGOOPHORECTOMY       Allergies  Allergies  Allergen Reactions  . Asa [Aspirin]   . Atenolol     headache  . Codeine   . Doxycycline     Gi upset stomach  . Dyazide [Hydrochlorothiazide W-Triamterene]   . Phenergan  [Promethazine]   . Septra [Sulfamethoxazole-Trimethoprim]     headaches and dizziness  . Verapamil     headache    Inpatient Medications    . cholecalciferol  1,000 Units Oral Daily  . citalopram  20 mg Oral Daily  . [START ON 04/12/2018] fentaNYL  12.5 mcg Transdermal Q72H  . fluticasone furoate-vilanterol  1 puff Inhalation Daily  . heparin  5,000 Units Subcutaneous Q8H  . hydrochlorothiazide  12.5 mg Oral Daily  . pantoprazole  40 mg Oral Daily  . zonisamide  75 mg Oral QHS    Family History    Family History  Problem Relation Age of Onset  . Colon cancer Mother   . Colon cancer Father    She indicated that her mother is alive. She indicated that her father is deceased.   Social History    Social History   Socioeconomic History  . Marital status: Married    Spouse name: Not on file  . Number of children: Not on file  . Years of education: Not on file  . Highest education level: Not on file  Occupational History  . Not on file  Social Needs  . Financial resource strain: Not on file  . Food insecurity:    Worry: Not on file    Inability: Not on file  . Transportation needs:    Medical: Not on file    Non-medical: Not on file  Tobacco Use  . Smoking status: Never Smoker  . Smokeless tobacco: Never Used  Substance and Sexual Activity  . Alcohol use: No  . Drug use: No  . Sexual activity: Not on file  Lifestyle  . Physical activity:    Days per week: Not on file    Minutes per session: Not on file  . Stress: Not on file  Relationships  . Social connections:    Talks on phone: Not on file    Gets together: Not on file    Attends religious service: Not on file    Active member of club or organization: Not on file    Attends meetings of clubs or organizations: Not on file    Relationship status: Not on file  . Intimate partner violence:    Fear of current or ex partner: Not on file    Emotionally abused: Not on file    Physically abused: Not on file      Forced sexual activity: Not on file  Other Topics Concern  . Not on file  Social History Narrative  . Not on file     Review of Systems    Review of Systems  Constitutional: Negative for chills, diaphoresis, fever and weight loss.  HENT: Positive for congestion. Negative for sore throat.   Respiratory: Positive for cough and shortness of breath. Negative for hemoptysis and sputum production.   Cardiovascular:  Positive for chest pain, orthopnea and PND. Negative for palpitations and leg swelling.  Gastrointestinal: Positive for nausea. Negative for abdominal pain, blood in stool and vomiting.  Genitourinary: Negative for hematuria.  Musculoskeletal: Positive for back pain, joint pain (left knee pain, recent surgery) and myalgias.  Skin: Positive for rash (hives).  Neurological: Positive for dizziness and tingling (left knee and leg). Negative for loss of consciousness.  Endo/Heme/Allergies: Does not bruise/bleed easily.  Psychiatric/Behavioral: Negative for substance abuse.    Physical Exam    Blood pressure (!) 148/71, pulse 79, temperature 97.9 F (36.6 C), temperature source Oral, resp. rate 16, height 5\' 4"  (1.626 m), weight 87.5 kg, SpO2 98 %.  General: 72 y.o. obese African-American female resting comfortably in no acute distress. Pleasant and cooperative. HEENT: Normal           Neck: Supple. No carotid bruits or JVD appreciated. Lungs: No increased work of breathing. Clear to auscultation bilaterally. No wheezes, rhonchi, or rales. Heart: RRR. Distinct S1 and S2. No murmurs, gallops, or rubs.  Abdomen: Soft, non-distended, and non-tender to palpation. Bowel sounds present.  Extremities: No lower extremity edema. Radial pulses 2+ and equal bilaterally. DP and PT 1+ and equal bilaterally. Lower extremities slightly cool to the touch. No evidence of cyanosis.  Skin: Hives noted on upper chest and back.  Neuro: Alert and oriented x3. No focal deficits. Moves all extremities  spontaneously. Psych: Normal affect.  Labs    Troponin (Point of Care Test) Recent Labs    04/09/18 1053  TROPIPOC 0.00   No results for input(s): CKTOTAL, CKMB, TROPONINI in the last 72 hours. Lab Results  Component Value Date   WBC 7.9 04/09/2018   HGB 15.0 04/09/2018   HCT 49.2 (H) 04/09/2018   MCV 80.8 04/09/2018   PLT 341 04/09/2018    Recent Labs  Lab 04/09/18 1045  NA 140  K 3.5  CL 106  CO2 25  BUN 14  CREATININE 0.90  CALCIUM 9.1  PROT 7.1  BILITOT 0.5  ALKPHOS 56  ALT 10  AST 18  GLUCOSE 121*   Lab Results  Component Value Date   CHOL  01/04/2009    197        ATP III CLASSIFICATION:  <200     mg/dL   Desirable  161-096  mg/dL   Borderline High  >=045    mg/dL   High          HDL 409 01/04/2009   LDLCALC  01/04/2009    75        Total Cholesterol/HDL:CHD Risk Coronary Heart Disease Risk Table                     Men   Women  1/2 Average Risk   3.4   3.3  Average Risk       5.0   4.4  2 X Average Risk   9.6   7.1  3 X Average Risk  23.4   11.0        Use the calculated Patient Ratio above and the CHD Risk Table to determine the patient's CHD Risk.        ATP III CLASSIFICATION (LDL):  <100     mg/dL   Optimal  811-914  mg/dL   Near or Above                    Optimal  130-159  mg/dL   Borderline  160-189  mg/dL   High  >161     mg/dL   Very High   TRIG 096 01/04/2009   Lab Results  Component Value Date   DDIMER 3.29 (H) 04/09/2018     Radiology Studies    Dg Chest 2 View  Result Date: 04/09/2018 CLINICAL DATA:  Shortness of breath for 2 days EXAM: CHEST - 2 VIEW COMPARISON:  11/05/2017 FINDINGS: The heart size and mediastinal contours are within normal limits. Both lungs are clear. The visualized skeletal structures are unremarkable. IMPRESSION: No active cardiopulmonary disease. Electronically Signed   By: Alcide Clever M.D.   On: 04/09/2018 11:28    EKG     EKG: EKG was personally reviewed and demonstrates: Normal sinus  rhythm, rate 80 bpm, with no acute ischemic changes from prior tracings. Telemetry: Telemetry was personally reviewed and demonstrates: Normal sinus rhythm, rate between 70s to 80s bpm.  Cardiac Imaging    Lexiscan Myoview 04/27/2015: No evidence of ischemia. _______________  Echocardiogram 01/04/2009: Study Conclusions: Left ventricle: The cavity size was normal. Systolic function was normal. The estimated ejection fraction was in the range of 60% to 65%. Although no diagnostic regional wall motion abnormality was identified, this possibility cannot be completely excluded on the basis of this study.  Impressions: No cardiac source of emboli was indentified.  Assessment & Plan    1. Chest Pain with Elevated Troponin - Patient presents with substernal chest pain that occurred at rest with radiation to back and shoulder blades.  - EKG showed no acute ischemic changes.  - Initial troponin in the ED negative but repeat troponin minimally elevated at 0.04. Will continue to trend troponin.  - Lexiscan Myoview from 2014 showed no ischemia. Patient complaining of exertional chest tightness and shortness of breath at that time. Patient continues to report dyspnea on exertion that she feel has worsened over the last year as well as some orthopnea and PND.  - D-dimer elevated at 3.29. Patient recently had knee surgery in 01/2018. CTA chest pending.  - Will check Echo. - If CTA negative for PE, will proceed with Lexiscan tomorrow for additional ischemic workup.   2. Hypertension - History of hypertension in chart but patient denies this. Patient states she is on HCTZ at home for lower extremity edema not blood pressure control. - Most recent BP 148/71. - Continue HCTZ 12.5mg  daily.   Signed, Corrin Parker, PA-C 04/09/2018, 3:07 PM  For questions or updates, please contact   Please consult [email or web address] for contact info under Cardiology/STEMI.  Patient examined chart reviewed  Discussed care with patient , husband and PA. Atypical pain. ECG with no acute changes Troponin essentially negative at .04 D dimer is elevated and had knee surgery in August Going for CTA now If no evidence of PE can have lexiscan myovue in am Exam with obese black female clear lungs no murmur plus one bilateral edema good peripheral pulses. CXR with NAD   Melinda Sanford

## 2018-04-09 NOTE — ED Triage Notes (Signed)
Patient arrived with chest tightness that radiated to back. She was given 324mg  ASA at the doctor's office, patient also presents with hives under her arms and back. Benadryl given to patient by EMS.  On arrival, patient denies pain

## 2018-04-09 NOTE — H&P (Signed)
TRH H&P   Patient Demographics:    Melinda Sanford, is a 72 y.o. female  MRN: [id number]   DOB - [birth year]  Admit Date - 04/09/2018  Outpatient Primary MD for the patient is Marden Noble, MD  Referring MD/NP/PA: Dr Dalene Seltzer  Patient coming from: PCP office  Comes with Chest pain    HPI:    Melinda Sanford  is a 72 y.o. female, tension, GERD, presents with complaints of chest pain, midsternal, radiating to the back, pressure quality, accompanied by some dyspnea, started at rest when she was sleeping, but worsened by exertion, relieved with aspirin at PCP office, denies any previous episodes of such chest pain in the past, he denies any hemoptysis, fever, chills, and recent travel, but she does report some left knee surgery 2 months ago, she denies any swelling, hemoptysis, by reviewing his record apparently she did saw Dr. Loretha Stapler in 2014, where she had a stress test then.  Lower extremity history of heart attack and her dad is a 25. In ED she was chest pain-free, EKG with some T wave abnormalities in anterior leads, troponins were negative, no significant blood work abnormalities, x-ray with no acute findings, I was called to admit    Review of systems:    In addition to the HPI above,  No Fever-chills, No Headache, No changes with Vision or hearing, No problems swallowing food or Liquids, Reports chest pain, with dyspnea, no cough No Abdominal pain, No Nausea or Vommitting, Bowel movements are regular, No Blood in stool or Urine, No dysuria, No new skin rashes or bruises, No new joints pains-aches,  No new weakness, tingling, numbness in any extremity, No recent weight gain or loss, No polyuria, polydypsia or polyphagia, No significant Mental Stressors.  A full 10 point Review of Systems was done, except as stated above, all other Review of Systems were  negative.   With Past History of the following :    Past Medical History:  Diagnosis Date  . Allergic rhinitis   . Anxiety   . Asthma   . Brachial neuritis or radiculitis NOS   . DJD (degenerative joint disease)    of the knees, right greater than left - Dr. Hardin Negus, May, 2011  . Dyspnea   . Fatigue   . GERD (gastroesophageal reflux disease)   . Hypertension   . Hyposmolality and/or hyponatremia   . Iron deficiency anemia, unspecified   . Pain in joint       Past Surgical History:  Procedure Laterality Date  . back surgery - Dr. Jeral Fruit    . KNEE SURGERY  01/2018  . lumbar surgery, Dr. Donalee Citrin, 2012    . right knee arthroscopy - Dr. Dion Saucier 2010/2011    . TOTAL ABDOMINAL HYSTERECTOMY W/ BILATERAL SALPINGOOPHORECTOMY        Social History:     Social History   Tobacco Use  .  Smoking status: Never Smoker  Substance Use Topics  . Alcohol use: No     Lives -at home  Mobility -independent     Family History :     Family History  Problem Relation Age of Onset  . Colon cancer Mother   . Colon cancer Father       Home Medications:   Prior to Admission medications   Medication Sig Start Date End Date Taking? Authorizing Provider  budesonide-formoterol (SYMBICORT) 160-4.5 MCG/ACT inhaler Inhale 2 puffs into the lungs 2 (two) times daily as needed (wheezing/shortness of breath).    Yes [provider]  cholecalciferol (VITAMIN D) 1000 UNITS tablet Take 1,000 Units by mouth daily.   Yes [provider]  citalopram (CELEXA) 20 MG tablet Take 20 mg by mouth daily.   Yes [provider]  cyclobenzaprine (FLEXERIL) 10 MG tablet Take 10 mg by mouth 3 (three) times daily as needed for muscle spasms.   Yes [provider]  estradiol (ESTRACE) 1 MG tablet Take 1 mg by mouth daily.    Yes [provider]  fentaNYL (DURAGESIC - DOSED MCG/HR) 12 MCG/HR Place 1 patch onto the skin every 3 (three) days.   Yes [provider]  hydrochlorothiazide (HYDRODIURIL) 25 MG tablet Take 12.5 mg by mouth daily. 02/17/18  Yes [provider]  omeprazole (PRILOSEC) 40 MG capsule Take 40 mg by mouth 2 (two) times daily.    Yes [provider]  promethazine (PHENERGAN) 25 MG tablet Take 25 mg by mouth daily as needed for nausea or vomiting.    Yes [provider]  zonisamide (ZONEGRAN) 25 MG capsule Take 75 mg by mouth at bedtime. Take 3 pills at bedtime    Yes [provider]     Allergies:     Allergies  Allergen Reactions  . Asa [Aspirin]   . Atenolol     headache  . Codeine   . Doxycycline     Gi upset stomach  . Dyazide [Hydrochlorothiazide W-Triamterene]   . Phenergan [Promethazine]   . Septra [Sulfamethoxazole-Trimethoprim]     headaches and dizziness  . Verapamil     headache     Physical Exam:   Vitals  Blood pressure 138/82, pulse (!) 115, temperature 98.1 F (36.7 C), temperature source Oral, resp. rate 15, height 5\' 4"  (1.626 m), weight 87.5 kg, SpO2 (!) 88 %.   1. General developed female, laying in bed in no apparent distress  2. Normal affect and insight, Not Suicidal or Homicidal, Awake Alert, Oriented X 3.  3. No F.N deficits, ALL C.Nerves Intact, Strength 5/5 all 4 extremities, Sensation intact all 4 extremities, Plantars down going.  4. Ears and Eyes appear Normal, Conjunctivae clear, PERRLA. Moist Oral Mucosa.  5. Supple Neck, No JVD, No cervical lymphadenopathy appriciated, No Carotid Bruits.  6. Symmetrical Chest wall movement, Good air movement bilaterally, CTAB.  7. RRR, No Gallops, Rubs or Murmurs, No Parasternal Heave.  8. Positive Bowel Sounds, Abdomen Soft, No tenderness, No organomegaly appriciated,No rebound -guarding or rigidity.  9.  No Cyanosis, Normal Skin Turgor.  10. Good muscle tone,  joints appear normal , no effusions, Normal ROM.  11. No Palpable Lymph Nodes in Neck or Axillae    Data Review:     CBC Recent Labs  Lab 04/09/18 1045  WBC 7.9  HGB 15.0  HCT 49.2*  PLT 341  MCV 80.8  MCH 24.6*  MCHC 30.5  RDW 14.8  LYMPHSABS 2.0  MONOABS 0.5  EOSABS 0.0  BASOSABS 0.0   ------------------------------------------------------------------------------------------------------------------  Chemistries  Recent Labs  Lab 04/09/18 1045  NA 140  K 3.5  CL 106  CO2 25  GLUCOSE 121*  BUN 14  CREATININE 0.90  CALCIUM 9.1  AST 18  ALT 10  ALKPHOS 56  BILITOT 0.5   ------------------------------------------------------------------------------------------------------------------ estimated creatinine clearance is 63.1 mL/min (by C-G formula based on SCr of 0.9 mg/dL). ------------------------------------------------------------------------------------------------------------------ No results for input(s): TSH, T4TOTAL, T3FREE, THYROIDAB in the last 72 hours.  Invalid input(s): FREET3  Coagulation profile No results for input(s): INR, PROTIME in the last 168 hours. ------------------------------------------------------------------------------------------------------------------- No results for input(s): DDIMER in the last 72 hours. -------------------------------------------------------------------------------------------------------------------  Cardiac Enzymes No results for input(s): CKMB, TROPONINI, MYOGLOBIN in the last 168 hours.  Invalid input(s): CK ------------------------------------------------------------------------------------------------------------------ No results found for: BNP   ---------------------------------------------------------------------------------------------------------------  Urinalysis No results found for: COLORURINE, APPEARANCEUR, LABSPEC, PHURINE, GLUCOSEU, HGBUR, BILIRUBINUR, KETONESUR, PROTEINUR, UROBILINOGEN, NITRITE,  LEUKOCYTESUR  ----------------------------------------------------------------------------------------------------------------   Imaging Results:    Dg Chest 2 View  Result Date: 04/09/2018 CLINICAL DATA:  Shortness of breath for 2 days EXAM: CHEST - 2 VIEW COMPARISON:  11/05/2017 FINDINGS: The heart size and mediastinal contours are within normal limits. Both lungs are clear. The visualized skeletal structures are unremarkable. IMPRESSION: No active cardiopulmonary disease. Electronically Signed   By: Alcide Clever M.D.   On: 04/09/2018 11:28    My personal review of EKG: Rhythm NSR, Rate  80 /min, QTc 448 , some T wave abnormalities in the anterior leads   Assessment & Plan:    Active Problems:   Chest pain  Chest pain -Some typical features as it is reproducible by exertion, resolved with aspirin, her troponin is negative, currently she is chest pain-free, received full dose aspirin in ED, went to telemetry, cycle her troponins, will check lipid panel. -Recent knee surgery in August, will obtain d-dimer, if elevated I will obtain CTA chest to rule out PE -I will consult cardiology to see if further work up is indicated.  Hypertension -Can you with home medications   DVT Prophylaxis Heparin -  SCDs   AM Labs Ordered, also please review Full Orders  Family Communication: Admission, patients condition and plan of care including tests being ordered have been discussed with the patient  who indicate understanding and agree with the plan and Code Status.  Code Status Full  Likely DC to  Home  Condition GUARDED    Consults called: cardiology  Admission status: observation  Time spent in minutes : 50 minutes   Huey Bienenstock M.D on 04/09/2018 at 1:08 PM  Between 7am to 7pm - Pager - [phone]. After 7pm go to [email or web address] - password [facility]  [facility] - Office  [phone]

## 2018-04-09 NOTE — Progress Notes (Signed)
Pt transported to CT ?

## 2018-04-09 NOTE — ED Provider Notes (Signed)
[facility] EMERGENCY DEPARTMENT Provider Note   CSN: [id number] Arrival date & time: 04/09/18  1017     History   Chief Complaint No chief complaint on file.   HPI Melinda Sanford is a 72 y.o. female.  HPI   Last night around 9PM, thought was having indigestion, couldn't lay down, tried to lay on the side, lay to the back, couldn't lay down, pain in the back and chest, chest felt tight,  Having chest pains and pain radiating to back and shoulder blades. Like pressure or tightness. Was 8/10, had to lay still.  Was afraid to move because of pain.  Lasted all night, eased off some this AM.  Then came and went throughout the morning.  Then was itching right side shoulder arm, hip with welts. Called dr. Then started to have indigestion again.  Took some soda and water without relief.  This morning had rash/itching.  With belching pain worsened, and walking made it worse  No shortness of breath, no nausea, no vomiting, no diaphoresis. Had some diarrhea.   Has had pain like this before, thought it was indigestion. Had lightheadedness.   Knee surgery August. No hx of long trips. Is on estrogen Dad has heart disease, older than 55  No smoking, no other drugs Htn, no other hlpd/DM  Past Medical History:  Diagnosis Date  . Allergic rhinitis   . Anxiety   . Asthma   . Brachial neuritis or radiculitis NOS   . DJD (degenerative joint disease)    of the knees, right greater than left - Dr. Hardin Negus, May, 2011  . Dyspnea   . Fatigue   . GERD (gastroesophageal reflux disease)   . Hypertension   . Hyposmolality and/or hyponatremia   . Iron deficiency anemia, unspecified   . Pain in joint   . Pre-diabetes     Patient Active Problem List   Diagnosis Date Noted  . Chest pain 04/09/2018  . Hypertension     Past Surgical History:  Procedure Laterality Date  . back surgery - Dr. Jeral Fruit    . KNEE SURGERY  01/2018  . lumbar surgery, Dr. Donalee Citrin, 2012      . right knee arthroscopy - Dr. Dion Saucier 2010/2011    . TOTAL ABDOMINAL HYSTERECTOMY W/ BILATERAL SALPINGOOPHORECTOMY       OB History   None      Home Medications    Prior to Admission medications   Medication Sig Start Date End Date Taking? Authorizing Provider  budesonide-formoterol (SYMBICORT) 160-4.5 MCG/ACT inhaler Inhale 2 puffs into the lungs 2 (two) times daily as needed (wheezing/shortness of breath).    Yes [provider]  cholecalciferol (VITAMIN D) 1000 UNITS tablet Take 1,000 Units by mouth daily.   Yes [provider]  citalopram (CELEXA) 20 MG tablet Take 20 mg by mouth daily.   Yes [provider]  cyclobenzaprine (FLEXERIL) 10 MG tablet Take 10 mg by mouth 3 (three) times daily as needed for muscle spasms.   Yes [provider]  estradiol (ESTRACE) 1 MG tablet Take 1 mg by mouth daily.    Yes [provider]  fentaNYL (DURAGESIC - DOSED MCG/HR) 12 MCG/HR Place 1 patch onto the skin every 3 (three) days.   Yes [provider]  hydrochlorothiazide (HYDRODIURIL) 25 MG tablet Take 12.5 mg by mouth daily. 02/17/18  Yes [provider]  omeprazole (PRILOSEC) 40 MG capsule Take 40 mg by mouth 2 (two) times daily.  Yes [provider]  promethazine (PHENERGAN) 25 MG tablet Take 25 mg by mouth daily as needed for nausea or vomiting.    Yes [provider]  zonisamide (ZONEGRAN) 25 MG capsule Take 75 mg by mouth at bedtime. Take 3 pills at bedtime    Yes [provider]    Family History Family History  Problem Relation Age of Onset  . Colon cancer Mother   . Colon cancer Father     Social History Social History   Tobacco Use  . Smoking status: Never Smoker  . Smokeless tobacco: Never Used  Substance Use Topics  . Alcohol use: No  . Drug use: No     Allergies   Asa [aspirin]; Atenolol; Codeine; Doxycycline; Dyazide [hydrochlorothiazide w-triamterene]; Phenergan  [promethazine]; Septra [sulfamethoxazole-trimethoprim]; and Verapamil   Review of Systems Review of Systems  Constitutional: Negative for fever.  HENT: Negative for sore throat.   Eyes: Negative for visual disturbance.  Respiratory: Positive for cough. Negative for shortness of breath.   Cardiovascular: Positive for chest pain. Negative for leg swelling.  Gastrointestinal: Negative for abdominal pain, nausea and vomiting.  Genitourinary: Negative for difficulty urinating.  Musculoskeletal: Negative for back pain and neck pain.  Skin: Positive for rash.  Neurological: Positive for light-headedness. Negative for syncope and headaches.     Physical Exam Updated Vital Signs BP (!) 148/71 (BP Location: Right Arm)   Pulse 79   Temp 97.9 F (36.6 C) (Oral)   Resp 16   Ht 5\' 4"  (1.626 m)   Wt 87.5 kg   SpO2 98%   BMI 33.13 kg/m   Physical Exam  Constitutional: She is oriented to person, place, and time. She appears well-developed and well-nourished. No distress.  HENT:  Head: Normocephalic and atraumatic.  Eyes: Conjunctivae and EOM are normal.  Neck: Normal range of motion.  Cardiovascular: Normal rate, regular rhythm, normal heart sounds and intact distal pulses. Exam reveals no gallop and no friction rub.  No murmur heard. Pulmonary/Chest: Effort normal and breath sounds normal. No respiratory distress. She has no wheezes. She has no rales.  Abdominal: Soft. She exhibits no distension. There is no tenderness. There is no guarding.  Musculoskeletal: She exhibits no edema or tenderness.  Neurological: She is alert and oriented to person, place, and time.  Skin: Skin is warm and dry. Rash (urticaria, diffuse) noted. She is not diaphoretic. No erythema.  Nursing note and vitals reviewed.    ED Treatments / Results  Labs (all labs ordered are listed, but only abnormal results are displayed) Labs Reviewed  CBC WITH DIFFERENTIAL/PLATELET - Abnormal; Notable for the following  components:      Result Value   RBC 6.09 (*)    HCT 49.2 (*)    MCH 24.6 (*)    All other components within normal limits  COMPREHENSIVE METABOLIC PANEL - Abnormal; Notable for the following components:   Glucose, Bld 121 (*)    All other components within normal limits  TROPONIN I - Abnormal; Notable for the following components:   Troponin I 0.04 (*)    All other components within normal limits  D-DIMER, QUANTITATIVE (NOT AT [facility]) - Abnormal; Notable for the following components:   D-Dimer, Quant 3.29 (*)    All other components within normal limits  TROPONIN I  TROPONIN I  CBC  BASIC METABOLIC PANEL  LIPID PANEL  HIV ANTIBODY (ROUTINE TESTING W REFLEX)  I-STAT TROPONIN, ED    EKG EKG Interpretation  Date/Time:  Thursday April 09 2018 10:24:25 EDT Ventricular Rate:  80 PR Interval:    QRS Duration: 76 QT Interval:  388 QTC Calculation: 448 R Axis:   72 Text Interpretation:  Sinus rhythm Low voltage, precordial leads Borderline T abnormalities, anterior leads Nonspecific changes in comparison to prior Confirmed by Alvira Monday ([id number]) on 04/09/2018 10:34:48 AM   Radiology Dg Chest 2 View  Result Date: 04/09/2018 CLINICAL DATA:  Shortness of breath for 2 days EXAM: CHEST - 2 VIEW COMPARISON:  11/05/2017 FINDINGS: The heart size and mediastinal contours are within normal limits. Both lungs are clear. The visualized skeletal structures are unremarkable. IMPRESSION: No active cardiopulmonary disease. Electronically Signed   By: Alcide Clever M.D.   On: 04/09/2018 11:28    Procedures Procedures (including critical care time)  Medications Ordered in ED Medications  fluticasone furoate-vilanterol (BREO ELLIPTA) 200-25 MCG/INH 1 puff (1 puff Inhalation Given 04/09/18 1503)  cholecalciferol (VITAMIN D) tablet 1,000 Units (1,000 Units Oral Given 04/09/18 1458)  citalopram (CELEXA) tablet 20 mg (20 mg Oral Given 04/09/18 1459)  cyclobenzaprine (FLEXERIL) tablet 10 mg  (has no administration in time range)  fentaNYL (DURAGESIC - dosed mcg/hr) 12.5 mcg (12.5 mcg Transdermal Patch Applied 04/09/18 1505)  hydrochlorothiazide (HYDRODIURIL) tablet 12.5 mg (12.5 mg Oral Given 04/09/18 1458)  pantoprazole (PROTONIX) EC tablet 40 mg (40 mg Oral Given 04/09/18 1459)  zonisamide (ZONEGRAN) capsule 75 mg (has no administration in time range)  acetaminophen (TYLENOL) tablet 650 mg (has no administration in time range)  ondansetron (ZOFRAN) injection 4 mg (4 mg Intravenous Given 04/09/18 1608)  heparin injection 5,000 Units (5,000 Units Subcutaneous Given 04/09/18 1459)  iopamidol (ISOVUE-370) 76 % injection (has no administration in time range)     Initial Impression / Assessment and Plan / ED Course  I have reviewed the triage vital signs and the nursing notes.  Pertinent labs & imaging results that were available during my care of the patient were reviewed by me and considered in my medical decision making (see chart for details).     72yo female with a history of hypertension presents with concern for left sided chest pressure from PCP office.  Differential diagnosis for chest pain includes pulmonary embolus, dissection, pneumothorax, pneumonia, ACS, myocarditis, pericarditis.  EKG was done and evaluate by me and showed no acute ST changes and no signs of pericarditis. Chest x-ray was done and evaluated by me and radiology and showed no sign of pneumonia or pneumothorax. Have low suspicion for PE in setting of no dyspnea, no tachycardia, no hypoxia and no asymmetric leg swelling.  Normal UE and LE pulses and normal mediastinum, not stabbing nature, doubt dissection.  Initial troponin negative.  Pt high risk HEAR score of 5.  Will admit for further care.   Of note, has hives on exam, unclear trigger. No signs of anaphylaxis.  Final Clinical Impressions(s) / ED Diagnoses   Final diagnoses:  Allergic urticaria  Chest pain, unspecified type    ED Discharge Orders     None       Alvira Monday, MD 04/09/18 1807

## 2018-04-10 ENCOUNTER — Observation Stay (HOSPITAL_BASED_OUTPATIENT_CLINIC_OR_DEPARTMENT_OTHER): Payer: Medicare Other

## 2018-04-10 DIAGNOSIS — R0789 Other chest pain: Secondary | ICD-10-CM | POA: Diagnosis not present

## 2018-04-10 DIAGNOSIS — I119 Hypertensive heart disease without heart failure: Secondary | ICD-10-CM | POA: Diagnosis not present

## 2018-04-10 DIAGNOSIS — I10 Essential (primary) hypertension: Secondary | ICD-10-CM

## 2018-04-10 DIAGNOSIS — R079 Chest pain, unspecified: Secondary | ICD-10-CM | POA: Diagnosis not present

## 2018-04-10 DIAGNOSIS — E876 Hypokalemia: Secondary | ICD-10-CM | POA: Diagnosis not present

## 2018-04-10 DIAGNOSIS — J90 Pleural effusion, not elsewhere classified: Secondary | ICD-10-CM | POA: Diagnosis present

## 2018-04-10 DIAGNOSIS — R7989 Other specified abnormal findings of blood chemistry: Secondary | ICD-10-CM | POA: Diagnosis not present

## 2018-04-10 DIAGNOSIS — Z9889 Other specified postprocedural states: Secondary | ICD-10-CM | POA: Diagnosis not present

## 2018-04-10 LAB — BASIC METABOLIC PANEL
ANION GAP: 14 (ref 5–15)
BUN: 14 mg/dL (ref 8–23)
CHLORIDE: 103 mmol/L (ref 98–111)
CO2: 20 mmol/L — AB (ref 22–32)
Calcium: 9.1 mg/dL (ref 8.9–10.3)
Creatinine, Ser: 0.98 mg/dL (ref 0.44–1.00)
GFR calc Af Amer: >60 mL/min (ref >60)
GFR calc non Af Amer: 58 mL/min — ABNORMAL LOW (ref >60)
GLUCOSE: 141 mg/dL — AB (ref 70–99)
POTASSIUM: 3 mmol/L — AB (ref 3.5–5.1)
Sodium: 137 mmol/L (ref 135–145)

## 2018-04-10 LAB — HIV ANTIBODY (ROUTINE TESTING W REFLEX): HIV SCREEN 4TH GENERATION: NONREACTIVE

## 2018-04-10 LAB — LIPID PANEL
CHOL/HDL RATIO: 1.8 ratio
Cholesterol: 175 mg/dL (ref 0–200)
HDL: 95 mg/dL (ref >40)
LDL CALC: 68 mg/dL (ref 0–99)
Triglycerides: 60 mg/dL (ref <150)
VLDL: 12 mg/dL (ref 0–40)

## 2018-04-10 LAB — NM MYOCAR MULTI W/SPECT W/WALL MOTION / EF
MPHR: 151 {beats}/min
Peak HR: 98 {beats}/min
Percent HR: 64 %
Rest HR: 76 {beats}/min

## 2018-04-10 LAB — CBC
HEMATOCRIT: 50.8 % — AB (ref 36.0–46.0)
HEMOGLOBIN: 15.8 g/dL — AB (ref 12.0–15.0)
MCH: 24.5 pg — ABNORMAL LOW (ref 26.0–34.0)
MCHC: 31.1 g/dL (ref 30.0–36.0)
MCV: 78.6 fL — ABNORMAL LOW (ref 80.0–100.0)
NRBC: 0 % (ref 0.0–0.2)
Platelets: 379 10*3/uL (ref 150–400)
RBC: 6.46 MIL/uL — AB (ref 3.87–5.11)
RDW: 14.8 % (ref 11.5–15.5)
WBC: 6.5 10*3/uL (ref 4.0–10.5)

## 2018-04-10 LAB — ECHOCARDIOGRAM COMPLETE
Height: 64 in
WEIGHTICAEL: 3088 [oz_av]

## 2018-04-10 LAB — MAGNESIUM: Magnesium: 2.1 mg/dL (ref 1.7–2.4)

## 2018-04-10 LAB — BRAIN NATRIURETIC PEPTIDE: B Natriuretic Peptide: 11 pg/mL (ref 0.0–100.0)

## 2018-04-10 LAB — TROPONIN I

## 2018-04-10 MED ORDER — POTASSIUM CHLORIDE CRYS ER 20 MEQ PO TBCR
40.0000 meq | EXTENDED_RELEASE_TABLET | Freq: Once | ORAL | Status: AC
  Administered 2018-04-10: 40 meq via ORAL
  Filled 2018-04-10: qty 2

## 2018-04-10 MED ORDER — AMINOPHYLLINE 25 MG/ML IV (NUC MED)
75.0000 mg | Freq: Once | INTRAVENOUS | Status: AC
  Administered 2018-04-10: 75 mg via INTRAVENOUS

## 2018-04-10 MED ORDER — TECHNETIUM TC 99M TETROFOSMIN IV KIT
30.0000 | PACK | Freq: Once | INTRAVENOUS | Status: AC | PRN
  Administered 2018-04-10: 30 via INTRAVENOUS

## 2018-04-10 MED ORDER — REGADENOSON 0.4 MG/5ML IV SOLN
0.4000 mg | Freq: Once | INTRAVENOUS | Status: AC
  Administered 2018-04-10: 0.4 mg via INTRAVENOUS
  Filled 2018-04-10: qty 5

## 2018-04-10 MED ORDER — REGADENOSON 0.4 MG/5ML IV SOLN
INTRAVENOUS | Status: AC
  Filled 2018-04-10: qty 5

## 2018-04-10 MED ORDER — AMINOPHYLLINE 25 MG/ML IV SOLN
INTRAVENOUS | Status: AC
  Filled 2018-04-10: qty 10

## 2018-04-10 MED ORDER — POTASSIUM CHLORIDE CRYS ER 20 MEQ PO TBCR
20.0000 meq | EXTENDED_RELEASE_TABLET | Freq: Every day | ORAL | Status: DC
  Administered 2018-04-10: 20 meq via ORAL
  Filled 2018-04-10: qty 1

## 2018-04-10 MED ORDER — TECHNETIUM TC 99M TETROFOSMIN IV KIT
10.0000 | PACK | Freq: Once | INTRAVENOUS | Status: AC | PRN
  Administered 2018-04-10: 10 via INTRAVENOUS

## 2018-04-10 MED ORDER — PERFLUTREN LIPID MICROSPHERE
1.0000 mL | INTRAVENOUS | Status: AC | PRN
  Administered 2018-04-10: 2 mL via INTRAVENOUS
  Filled 2018-04-10: qty 10

## 2018-04-10 NOTE — Progress Notes (Signed)
  Echocardiogram 2D Echocardiogram has been performed.  Melinda Sanford 04/10/2018, 8:48 AM

## 2018-04-10 NOTE — Progress Notes (Signed)
   Melinda Sanford presented for a nuclear stress test today.  No immediate complications, but aminophylline was administered per protocol given prolonged side effects with Lexiscan despite watchful waiting (SOB, leg pain, arm pain).  Stress imaging is pending at this time.  Preliminary EKG findings may be listed in the chart, but the stress test result will not be finalized until perfusion imaging is complete.  Laurann Montana, PA-C 04/10/2018, 11:07 AM

## 2018-04-10 NOTE — Progress Notes (Addendum)
Progress Note  Patient Name: Melinda Sanford Date of Encounter: 04/10/2018  Primary Cardiologist: Charlton Haws, MD  Subjective   Feeling fine this AM. No CP or SOB. Currently getting echocardiogram. Reports itching related to hives (present prior to admission).  Inpatient Medications    Scheduled Meds: . cholecalciferol  1,000 Units Oral Daily  . citalopram  20 mg Oral Daily  . [START ON 04/12/2018] fentaNYL  12.5 mcg Transdermal Q72H  . fluticasone furoate-vilanterol  1 puff Inhalation Daily  . heparin  5,000 Units Subcutaneous Q8H  . hydrochlorothiazide  12.5 mg Oral Daily  . pantoprazole  40 mg Oral Daily  . potassium chloride  20 mEq Oral Daily  . potassium chloride  40 mEq Oral Once  . zonisamide  75 mg Oral QHS   Continuous Infusions:  PRN Meds: acetaminophen, cyclobenzaprine, ondansetron (ZOFRAN) IV   Vital Signs    Vitals:   04/09/18 1344 04/09/18 2002 04/10/18 0003 04/10/18 0339  BP: (!) 148/71 140/60 116/70 128/69  Pulse: 79 92 90 80  Resp: 16 18 16 16   Temp: 97.9 F (36.6 C) 98.8 F (37.1 C) 99.3 F (37.4 C) 98.5 F (36.9 C)  TempSrc: Oral Oral Oral Oral  SpO2: 98% 99% 95% 98%  Weight: 87.5 kg     Height: 5\' 4"  (1.626 m)      No intake or output data in the 24 hours ending 04/10/18 0833 Filed Weights   04/09/18 1023 04/09/18 1344  Weight: 87.5 kg 87.5 kg    Telemetry    NSR, 5 beat run atrial tach - Personally Reviewed  ECG    NSR low voltage QRS no acute changes - Personally Reviewed  Physical Exam   GEN: No acute distress.  HEENT: Normocephalic, atraumatic, sclera non-icteric. Neck: No JVD or bruits. Cardiac: RRR no murmurs, rubs, or gallops.  Radials/DP/PT 1+ and equal bilaterally.  Respiratory: Clear to auscultation bilaterally. Breathing is unlabored. GI: Soft, nontender, non-distended, BS +x 4. MS: no deformity. Did not examine back given ongoing echocardiogram. Extremities: No clubbing or cyanosis. Mild BLE edema without  palpable cords. Distal pedal pulses are 2+ and equal bilaterally. Neuro:  AAOx3. Follows commands. Psych:  Responds to questions appropriately with a normal affect.  Labs    Chemistry Recent Labs  Lab 04/09/18 1045 04/10/18 0630  NA 140 137  K 3.5 3.0*  CL 106 103  CO2 25 20*  GLUCOSE 121* 141*  BUN 14 14  CREATININE 0.90 0.98  CALCIUM 9.1 9.1  PROT 7.1  --   ALBUMIN 4.0  --   AST 18  --   ALT 10  --   ALKPHOS 56  --   BILITOT 0.5  --   GFRNONAA >60 58*  GFRAA >60 >60  ANIONGAP 9 14     Hematology Recent Labs  Lab 04/09/18 1045 04/10/18 0630  WBC 7.9 6.5  RBC 6.09* 6.46*  HGB 15.0 15.8*  HCT 49.2* 50.8*  MCV 80.8 78.6*  MCH 24.6* 24.5*  MCHC 30.5 31.1  RDW 14.8 14.8  PLT 341 379    Cardiac Enzymes Recent Labs  Lab 04/09/18 1419 04/09/18 1710 04/09/18 2350  TROPONINI 0.04* <0.03 <0.03    Recent Labs  Lab 04/09/18 1053  TROPIPOC 0.00     BNPNo results for input(s): BNP, PROBNP in the last 168 hours.   DDimer  Recent Labs  Lab 04/09/18 1045  DDIMER 3.29*     Radiology    Dg Chest 2 View  Result Date: 04/09/2018 CLINICAL DATA:  Shortness of breath for 2 days EXAM: CHEST - 2 VIEW COMPARISON:  11/05/2017 FINDINGS: The heart size and mediastinal contours are within normal limits. Both lungs are clear. The visualized skeletal structures are unremarkable. IMPRESSION: No active cardiopulmonary disease. Electronically Signed   By: Alcide Clever M.D.   On: 04/09/2018 11:28   Ct Angio Chest Pe W Or Wo Contrast  Result Date: 04/09/2018 CLINICAL DATA:  Positive D-dimer EXAM: CT ANGIOGRAPHY CHEST WITH CONTRAST TECHNIQUE: Multidetector CT imaging of the chest was performed using the standard protocol during bolus administration of intravenous contrast. Multiplanar CT image reconstructions and MIPs were obtained to evaluate the vascular anatomy. CONTRAST:  ISOVUE-370 IOPAMIDOL (ISOVUE-370) INJECTION 76% COMPARISON:  10/17/2010 FINDINGS:  Cardiovascular: Mild cardiomegaly. Aorta is normal caliber. No filling defects in the pulmonary arteries to suggest pulmonary emboli. Mediastinum/Nodes: No mediastinal, hilar, or axillary adenopathy. Lungs/Pleura: Trace bilateral pleural effusions. No confluent opacities or edema. Upper Abdomen: Scattered hypodensities in the liver, likely cysts. No acute findings. Musculoskeletal: Chest wall soft tissues are unremarkable. No acute bony abnormality. Review of the MIP images confirms the above findings. IMPRESSION: No evidence of pulmonary embolus. Mild cardiomegaly. Trace bilateral effusions. Electronically Signed   By: Charlett Nose M.D.   On: 04/09/2018 19:15    Cardiac Studies   Pending  Patient Profile     72 y.o. female with HTN and GERD who was admitted with chest pain, also woke up with Hives on her back after recently washing clothes in [place] (which she felt she is allergic to) and eating pumpkin pudding she hasn't had before.  Assessment & Plan    1. Chest pain - 1 out of 4 values borderline elevated, subsequent values neg. CTA neg for PE but with trace bilateral effusions. Add BNP to labs. Await echo. Lexiscan nuc today.  2. HTN - BP mildly elevated yesterday but normal today, follow.  3. Hypokalemia - give po now, then start daily this afternoon as she has been intermittently hypokalemic in the past. Check Mg.  4. Hives/itching - per IM. Present prior to any testing this admission.  5. Elevated d-dimer - mild edema noted on exam. Would consider lower extremity venous duplex given knee surgery in August.  For questions or updates, please contact [facility] Please consult [email or web address] for contact info under Cardiology/STEMI.  Signed, Laurann Montana, PA-C 04/10/2018, 8:33 AM    Patient examined chart reviewed no pain this am CTA no PE despite fairly elevated d dimer and recent knee surgery BP better for myovue this am Can be d/c if non ischemic Exam with obese  black female clear lungs no murmur trace LE edema  Charlton Haws

## 2018-04-10 NOTE — Care Management Obs Status (Signed)
MEDICARE OBSERVATION STATUS NOTIFICATION   Patient Details  Name: Melinda Sanford MRN: [id number] Date of Birth: 1945/12/13   Medicare Observation Status Notification Given:  Yes    Oletta Cohn, RN 04/10/2018, 12:31 PM

## 2018-04-10 NOTE — Progress Notes (Signed)
Pt SpO2 98% while ambulating.  HR 106

## 2018-04-10 NOTE — Progress Notes (Addendum)
   Echocardiogram and nuclear stress test were reviewed by Dr. Eden Emms and felt to be reassuring. Official read on nuc not back yet but d/w Dr. Tenny Craw who agrees low risk without definite evidence for ischemia. Discussed results with patient and IM. Pt does still report some leg achiness and recent swelling so agree with plan for LE duplex prior to DC. We will see her back 11/26 at 10am (added to AVS).  Would also suggest f/u with primary care as OP for her hypokalemia.   Jahnessa Vanduyn PA-C

## 2018-04-10 NOTE — Progress Notes (Signed)
*  Preliminary Results* Bilateral lower extremity venous duplex completed. Bilateral lower extremities are negative for deep vein thrombosis. There is no evidence of Baker's cyst bilaterally.  04/10/2018 3:53 PM Gertie Fey, MHA, RVT, RDCS, RDMS

## 2018-04-10 NOTE — Discharge Summary (Signed)
Physician Discharge Summary  Melinda Sanford HKV:[id number] DOB: 12-19-1945 DOA: 04/09/2018  PCP: Josetta Huddle, MD  Admit date: 04/09/2018 Discharge date: 04/10/2018  Time spent: 45 minutes  Recommendations for Outpatient Follow-up:  1. Follow up with Cardiology as scheduled 05/12/2018 2. Follow up with PCP 1 week for evaluation of potassium and magnesium level.    Discharge Diagnoses:  Principal Problem:   Chest pain Active Problems:   Hypertension   Hypokalemia   Chronic bilateral pleural effusions   Discharge Condition: stable  Diet recommendation: heart healthy  Filed Weights   04/09/18 1023 04/09/18 1344  Weight: 87.5 kg 87.5 kg    History of present illness:  Melinda Sanford  is a 72 y.o. female, tension, GERD, presented on 10/24 with complaints of chest pain, midsternal, radiating to the back, pressure quality, accompanied by some dyspnea, started at rest when she was sleeping, but worsened by exertion, relieved with aspirin at PCP office, denied any previous episodes of such chest pain in the past, he denied any hemoptysis, fever, chills, and recent travel, but she did report some left knee surgery 2 months ago, she denied any swelling, hemoptysis, by reviewing his record apparently she did saw Dr. Doyce Loose in 2014, where she had a stress test then.  Lower extremity history of heart attack and her dad is a 6. In ED she was chest pain-free, EKG with some T wave abnormalities in anterior leads, troponins were negative, no significant blood work abnormalities, x-ray with no acute findings.   Hospital Course:  Chest pain -Some typical features as it is reproducible by exertion, resolved with aspirin, her troponin is negative, chest pain-free at time of admission, received full dose aspirin in ED, no events on telemetry,  troponins negative, CTA neg for PE but with trace bilateral effusions,  lipid panel within limits of normal. Evaluated by cardiology who recommended  stress test which at time of discharge unofficial result no evidence for ischemia. Additionally Dopplar done for leg achiness and preliminary result negative for DVT. Ambulated in hall with unsteady gait likely related to recent knee surgery. Oxygen saturation level 98% on room air with ambulation.  She has cardiology appointment 11/26.   Hypertension: controlle -continue home medications  Procedures:  Echo  Stress test  LE dopplar  Consultations:  nishan cardiology  Discharge Exam: Vitals:   04/10/18 1103 04/10/18 1500  BP: (!) 152/94 136/79  Pulse: 86 92  Resp:  15  Temp:  98.8 F (37.1 C)  SpO2:  100%    General: well nourished in no acute distress Cardiovascular: rrr no MGR trace LE edema Respiratory: normal effort BS clear bilaterally no wheeze  Discharge Instructions   Discharge Instructions    Call MD for:  difficulty breathing, headache or visual disturbances   Complete by:  As directed    Call MD for:  persistant nausea and vomiting   Complete by:  As directed    Diet - low sodium heart healthy   Complete by:  As directed    Discharge instructions   Complete by:  As directed    Follow up with PCP 1-2 weeks for evaluation of electrolytes.   Increase activity slowly   Complete by:  As directed       Allergies  Allergen Reactions  . Asa [Aspirin]   . Atenolol     headache  . Codeine   . Doxycycline     Gi upset stomach  . Dyazide [Hydrochlorothiazide W-Triamterene]   . Phenergan [Promethazine]   .  Septra [Sulfamethoxazole-Trimethoprim]     headaches and dizziness  . Verapamil     headache   Follow-up Information    Charlie Pitter, PA-C Follow up.   Specialties:  Cardiology, Radiology Why:  [facility] - 05/12/18 at 10am. Arrive 15 minutes prior to check in. Lisbeth Renshaw is one of the PAs that you met in the hospital who works closely with Dr. Irish Lack and his care team. Contact information: [address] [place]  [address] [phone]            The results of significant diagnostics from this hospitalization (including imaging, microbiology, ancillary and laboratory) are listed below for reference.    Significant Diagnostic Studies: Dg Chest 2 View  Result Date: 04/09/2018 CLINICAL DATA:  Shortness of breath for 2 days EXAM: CHEST - 2 VIEW COMPARISON:  11/05/2017 FINDINGS: The heart size and mediastinal contours are within normal limits. Both lungs are clear. The visualized skeletal structures are unremarkable. IMPRESSION: No active cardiopulmonary disease. Electronically Signed   By: Inez Catalina M.D.   On: 04/09/2018 11:28   Ct Angio Chest Pe W Or Wo Contrast  Result Date: 04/09/2018 CLINICAL DATA:  Positive D-dimer EXAM: CT ANGIOGRAPHY CHEST WITH CONTRAST TECHNIQUE: Multidetector CT imaging of the chest was performed using the standard protocol during bolus administration of intravenous contrast. Multiplanar CT image reconstructions and MIPs were obtained to evaluate the vascular anatomy. CONTRAST:  129m ISOVUE-370 IOPAMIDOL (ISOVUE-370) INJECTION 76% COMPARISON:  10/17/2010 FINDINGS: Cardiovascular: Mild cardiomegaly. Aorta is normal caliber. No filling defects in the pulmonary arteries to suggest pulmonary emboli. Mediastinum/Nodes: No mediastinal, hilar, or axillary adenopathy. Lungs/Pleura: Trace bilateral pleural effusions. No confluent opacities or edema. Upper Abdomen: Scattered hypodensities in the liver, likely cysts. No acute findings. Musculoskeletal: Chest wall soft tissues are unremarkable. No acute bony abnormality. Review of the MIP images confirms the above findings. IMPRESSION: No evidence of pulmonary embolus. Mild cardiomegaly. Trace bilateral effusions. Electronically Signed   By: KRolm BaptiseM.D.   On: 04/09/2018 19:15   Nm Myocar Multi W/spect W/wall Motion / Ef  Result Date: 04/10/2018  Probable normal perfusion and mild soft tissue attenuation (breast) No significant  ischemia or scar.  This is a low risk study.  Nuclear stress EF: 70%.     Microbiology: No results found for this or any previous visit (from the past 240 hour(s)).   Labs: Basic Metabolic Panel: Recent Labs  Lab 04/09/18 1045 04/10/18 0630  NA 140 137  K 3.5 3.0*  CL 106 103  CO2 25 20*  GLUCOSE 121* 141*  BUN 14 14  CREATININE 0.90 0.98  CALCIUM 9.1 9.1  MG  --  2.1   Liver Function Tests: Recent Labs  Lab 04/09/18 1045  AST 18  ALT 10  ALKPHOS 56  BILITOT 0.5  PROT 7.1  ALBUMIN 4.0   No results for input(s): LIPASE, AMYLASE in the last 168 hours. No results for input(s): AMMONIA in the last 168 hours. CBC: Recent Labs  Lab 04/09/18 1045 04/10/18 0630  WBC 7.9 6.5  NEUTROABS 5.4  --   HGB 15.0 15.8*  HCT 49.2* 50.8*  MCV 80.8 78.6*  PLT 341 379   Cardiac Enzymes: Recent Labs  Lab 04/09/18 1419 04/09/18 1710 04/09/18 2350  TROPONINI 0.04* <0.03 <0.03   BNP: BNP (last 3 results) Recent Labs    04/10/18 0630  BNP 11.0    ProBNP (last 3 results) No results for input(s): PROBNP in the last 8760  hours.  CBG: No results for input(s): GLUCAP in the last 168 hours.     Signed:  Radene Gunning MD.  [facility] 04/10/2018, 4:42 PM

## 2018-04-10 NOTE — Progress Notes (Signed)
Ambulated patient in hall.  Still a little dizzy, and walking with a limp.  Definitely did not feel that patient was stable.

## 2018-05-12 ENCOUNTER — Ambulatory Visit: Payer: Medicare Other | Admitting: Physician Assistant

## 2018-06-03 ENCOUNTER — Encounter: Payer: Self-pay | Admitting: Physician Assistant

## 2018-06-03 NOTE — Progress Notes (Deleted)
Cardiology Office Note    Date:  06/04/2018  ID:  Melinda Sanford, DOB 1946/03/31, MRN [id number] PCP:  Marden NobleGates, Robert, MD  Cardiologist:  Charlton HawsPeter Nishan, MD   Chief Complaint: f/u chest discomfort  History of Present Illness:  Melinda Sanford is a 72 y.o. female with history of HTN and GERD who presents for post-hospital follow-up. She was remotely seen by Dr. Eldridge DaceVaranasi for chest pain and he reported stress test was negative. More recently she was admitted with chest pressure for several hours. She also has had DOE, She had also woken up with hives on her back after recently washing clothes in [place] (which she felt she is allergic to) and eating pumpkin pudding she hasn't had before. Troponins were all negative except one value 0.04. Dr. Eden EmmsNishan consulted. Nuclear stress test was normal with EF 70%. 2D echo showed EF 60-65%, normal diastolic parameters. CTA was negative for PE, did show mild cardiomegaly and trace bilateral effusions. LE venous duplex as negative. No further inpatient workup was required. Labs also did show hypokalemia 3.0, Hgb 15.8, Cr 0.98, glucose 141, BNP wnl, Mg 2.1, LDL 68.  Bmet, a1c, tsh?   Chest pressure Essential HTN Hypokalemia Hyperglycemia    Past Medical History:  Diagnosis Date  . Allergic rhinitis   . Anxiety   . Asthma   . Brachial neuritis or radiculitis NOS   . DJD (degenerative joint disease)    of the knees, right greater than left - Dr. Hardin NegusJoseph Landau, May, 2011  . GERD (gastroesophageal reflux disease)   . Hypertension   . Hyposmolality and/or hyponatremia   . Iron deficiency anemia, unspecified   . Pain in joint   . Pre-diabetes     Past Surgical History:  Procedure Laterality Date  . back surgery - Dr. Jeral FruitBotero    . KNEE SURGERY  01/2018  . lumbar surgery, Dr. Donalee CitrinGary Cram, 2012    . right knee arthroscopy - Dr. Dion SaucierLandau 2010/2011    . TOTAL ABDOMINAL HYSTERECTOMY W/ BILATERAL SALPINGOOPHORECTOMY      Current Medications: No outpatient  medications have been marked as taking for the 06/04/18 encounter (Appointment) with Laurann Montanaunn, Loyce Klasen N, PA-C.   ***   Allergies:   Asa [aspirin]; Atenolol; Codeine; Doxycycline; Dyazide [hydrochlorothiazide w-triamterene]; Phenergan [promethazine]; Septra [sulfamethoxazole-trimethoprim]; and Verapamil   Social History   Socioeconomic History  . Marital status: Married    Spouse name: Not on file  . Number of children: Not on file  . Years of education: Not on file  . Highest education level: Not on file  Occupational History  . Not on file  Social Needs  . Financial resource strain: Not on file  . Food insecurity:    Worry: Not on file    Inability: Not on file  . Transportation needs:    Medical: Not on file    Non-medical: Not on file  Tobacco Use  . Smoking status: Never Smoker  . Smokeless tobacco: Never Used  Substance and Sexual Activity  . Alcohol use: No  . Drug use: No  . Sexual activity: Not on file  Lifestyle  . Physical activity:    Days per week: Not on file    Minutes per session: Not on file  . Stress: Not on file  Relationships  . Social connections:    Talks on phone: Not on file    Gets together: Not on file    Attends religious service: Not on file    Active member  of club or organization: Not on file    Attends meetings of clubs or organizations: Not on file    Relationship status: Not on file  Other Topics Concern  . Not on file  Social History Narrative  . Not on file     Family History:  The patient's ***family history includes Colon cancer in her father and mother.  ROS:   Please see the history of present illness. Otherwise, review of systems is positive for ***.  All other systems are reviewed and otherwise negative.    PHYSICAL EXAM:   VS:  There were no vitals taken for this visit.  BMI: There is no height or weight on file to calculate BMI. GEN: Well nourished, well developed, in no acute distress HEENT: normocephalic,  atraumatic Neck: no JVD, carotid bruits, or masses Cardiac: ***RRR; no murmurs, rubs, or gallops, no edema  Respiratory:  clear to auscultation bilaterally, normal work of breathing GI: soft, nontender, nondistended, + BS MS: no deformity or atrophy Skin: warm and dry, no rash Neuro:  Alert and Oriented x 3, Strength and sensation are intact, follows commands Psych: euthymic mood, full affect  Wt Readings from Last 3 Encounters:  04/09/18 193 lb (87.5 kg)  04/26/13 194 lb (88 kg)  04/02/13 196 lb (88.9 kg)      Studies/Labs Reviewed:   EKG:  EKG was ordered today and personally reviewed by me and demonstrates *** EKG was not ordered today.***  Recent Labs: 04/09/2018: ALT 10 04/10/2018: B Natriuretic Peptide 11.0; BUN 14; Creatinine, Ser 0.98; Hemoglobin 15.8; Magnesium 2.1; Platelets 379; Potassium 3.0; Sodium 137   Lipid Panel    Component Value Date/Time   CHOL 175 04/10/2018 0630   TRIG 60 04/10/2018 0630   HDL 95 04/10/2018 0630   CHOLHDL 1.8 04/10/2018 0630   VLDL 12 04/10/2018 0630   LDLCALC 68 04/10/2018 0630    Additional studies/ records that were reviewed today include: Summarized above.***    ASSESSMENT & PLAN:   1. ***  Disposition: F/u with ***   Medication Adjustments/Labs and Tests Ordered: Current medicines are reviewed at length with the patient today.  Concerns regarding medicines are outlined above. Medication changes, Labs and Tests ordered today are summarized above and listed in the Patient Instructions accessible in Encounters.   Signed, Laurann Montana, PA-C  06/04/2018 7:48 AM    [facility] [address] [place], [place], [place]  [address] Phone: [phone]; Fax: [phone]

## 2018-06-04 ENCOUNTER — Ambulatory Visit: Payer: Medicare Other | Admitting: Physician Assistant

## 2018-06-05 ENCOUNTER — Encounter: Payer: Self-pay | Admitting: Physician Assistant

## 2019-08-26 ENCOUNTER — Encounter (INDEPENDENT_AMBULATORY_CARE_PROVIDER_SITE_OTHER): Payer: Self-pay

## 2019-08-26 ENCOUNTER — Ambulatory Visit
Admission: RE | Admit: 2019-08-26 | Discharge: 2019-08-26 | Disposition: A | Discharge status: Home / Self Care | Payer: Medicare Other | Source: Ambulatory Visit | Attending: Internal Medicine | Admitting: Internal Medicine

## 2019-08-26 ENCOUNTER — Other Ambulatory Visit: Payer: Self-pay | Admitting: Internal Medicine

## 2019-08-26 DIAGNOSIS — M25512 Pain in left shoulder: Secondary | ICD-10-CM

## 2019-10-28 ENCOUNTER — Ambulatory Visit (INDEPENDENT_AMBULATORY_CARE_PROVIDER_SITE_OTHER): Payer: Medicare Other | Admitting: Internal Medicine

## 2019-10-28 ENCOUNTER — Encounter: Payer: Self-pay | Admitting: Internal Medicine

## 2019-10-28 ENCOUNTER — Other Ambulatory Visit: Payer: Self-pay

## 2019-10-28 DIAGNOSIS — G4459 Other complicated headache syndrome: Secondary | ICD-10-CM | POA: Diagnosis not present

## 2019-10-28 DIAGNOSIS — R0683 Snoring: Secondary | ICD-10-CM

## 2019-10-28 NOTE — Progress Notes (Signed)
10/28/19- 40 yoF never smoker for sleep evaluation referred courtesy of Dr Neale [facility]. Medical problem list includes HTN, Chronic bilateral Pleural Effusions, Hypokalemia, DM2,  Report of a CPAP titration sleep study was provided, dated 10/28/2007 but a baseline diagnostic study was not included.  Epworth score 16 Body weight today 202 lbs Meds include Ventolin hfa, Symbicort 150, Flonase,     Fentanyl patch Hopes that better sleep will help headaches. Can't sleep well at night. Bedtime 11PM-1AM, Latency 1-2 hours, wakes 3 times before up at 9AM. Told of loud snoring. Falls asleep in conversation siting up in day and begins snoring. Witnessed apnea. Told she reaches and does "stuff with my hands" in sleep. No sleep meds and no caffeine except green tea. ENT- tonsils and sepetoplasty. DOE stairs and steps. Inhalers helps ome, but she denies cough or wheeze.  Weight gain 40 lbs recent years.  CTa chest 04/09/18-  IMPRESSION: No evidence of pulmonary embolus. Mild cardiomegaly. Trace bilateral effusions.  Prior to Admission medications   Medication Sig Start Date End Date Taking? Authorizing Provider  albuterol (VENTOLIN HFA) 108 (90 Base) MCG/ACT inhaler SMARTSIG:2 Puff(s) By Mouth Every 4 Hours PRN 09/06/19  Yes [provider]  budesonide-formoterol (SYMBICORT) 160-4.5 MCG/ACT inhaler Inhale 2 puffs into the lungs 2 (two) times daily as needed (wheezing/shortness of breath).    Yes [provider]  cholecalciferol (VITAMIN D) 1000 UNITS tablet Take 1,000 Units by mouth daily.   Yes [provider]  citalopram (CELEXA) 20 MG tablet Take 20 mg by mouth daily.   Yes [provider]  cyclobenzaprine (FLEXERIL) 10 MG tablet Take 10 mg by mouth 3 (three) times daily as needed for muscle spasms.   Yes [provider]  estradiol (ESTRACE) 1 MG tablet Take 1 mg by mouth daily.    Yes [provider]  fentaNYL (DURAGESIC - DOSED  MCG/HR) 12 MCG/HR Place 1 patch onto the skin every 3 (three) days.   Yes [provider]  ferrous sulfate 325 (65 FE) MG tablet Take by mouth.   Yes [provider]  fluticasone (FLONASE) 50 MCG/ACT nasal spray Place into the nose.   Yes [provider]  losartan (COZAAR) 50 MG tablet Take 50 mg by mouth daily. 10/26/19  Yes [provider]  mirabegron ER (MYRBETRIQ) 25 MG TB24 tablet Take by mouth. 10/20/19  Yes [provider]  omeprazole (PRILOSEC) 40 MG capsule Take 40 mg by mouth 2 (two) times daily.    Yes [provider]  promethazine (PHENERGAN) 25 MG tablet Take 25 mg by mouth daily as needed for nausea or vomiting.    Yes [provider]  topiramate (TOPAMAX) 100 MG tablet Take 100 mg by mouth 2 (two) times daily.   Yes [provider]   Past Medical History:  Diagnosis Date  . Allergic rhinitis   . Anxiety   . Asthma   . Brachial neuritis or radiculitis NOS   . DJD (degenerative joint disease)    of the knees, right greater than left - Dr. Hardin Negus, May, 2011  . GERD (gastroesophageal reflux disease)   . Hypertension   . Hyposmolality and/or hyponatremia   . Iron deficiency anemia, unspecified   . Pain in joint   . Pre-diabetes    Past Surgical History:  Procedure Laterality Date  . back surgery - Dr. Jeral Fruit    . KNEE SURGERY  01/2018  . lumbar surgery, Dr. Donalee Citrin, 2012    .  right knee arthroscopy - Dr. Mardelle Matte 2010/2011    . TOTAL ABDOMINAL HYSTERECTOMY W/ BILATERAL SALPINGOOPHORECTOMY     Family History  Problem Relation Age of Onset  . Colon cancer Mother   . Colon cancer Father    Social History   Socioeconomic History  . Marital status: Married    Spouse name: Not on file  . Number of children: Not on file  . Years of education: Not on file  . Highest education level: Not on file  Occupational History  . Not on file  Tobacco Use  . Smoking status: Never Smoker  . Smokeless  tobacco: Never Used  Substance and Sexual Activity  . Alcohol use: No  . Drug use: No  . Sexual activity: Not on file  Other Topics Concern  . Not on file  Social History Narrative  . Not on file   Social Determinants of Health   Financial Resource Strain:   . Difficulty of Paying Living Expenses:   Food Insecurity:   . Worried About Charity fundraiser in the Last Year:   . Arboriculturist in the Last Year:   Transportation Needs:   . Film/video editor (Medical):   Marland Kitchen Lack of Transportation (Non-Medical):   Physical Activity:   . Days of Exercise per Week:   . Minutes of Exercise per Session:   Stress:   . Feeling of Stress :   Social Connections:   . Frequency of Communication with Friends and Family:   . Frequency of Social Gatherings with Friends and Family:   . Attends Religious Services:   . Active Member of Clubs or Organizations:   . Attends Archivist Meetings:   Marland Kitchen Marital Status:   Intimate Partner Violence:   . Fear of Current or Ex-Partner:   . Emotionally Abused:   Marland Kitchen Physically Abused:   . Sexually Abused:    ROS-see HPI   + = positive Constitutional:    weight loss, night sweats, fevers, chills, fatigue, lassitude. HEENT:    +headaches, difficulty swallowing, +tooth/dental problems, sore throat,       +sneezing, itching, ear ache, +nasal congestion, post nasal drip, snoring CV:    chest pain, orthopnea, PND, swelling in lower extremities, anasarca,                                  dizziness, palpitations Resp:   +shortness of breath with exertion or at rest.                productive cough,   non-productive cough, coughing up of blood.              change in color of mucus.  wheezing.   Skin:    rash or lesions. GI:  No-   heartburn, indigestion, abdominal pain, nausea, vomiting, diarrhea,                 change in bowel habits, loss of appetite GU: dysuria, change in color of urine, no urgency or frequency.   flank pain. MS:   joint pain,  stiffness, decreased range of motion, back pain. Neuro-     nothing unusual Psych:  change in mood or affect.  depression or anxiety.   memory loss.  OBJ- Physical Exam General- Alert, Oriented, Affect-appropriate, Distress- none acute, + obese Skin- rash-none, lesions- none, excoriation- none     + Fentanyl patch on  shoulder Lymphadenopathy- none Head- atraumatic            Eyes- Gross vision intact, PERRLA, conjunctivae and secretions clear            Ears- Hearing, canals-normal            Nose- Clear, no-Septal dev, mucus, polyps, erosion, perforation             Throat- Mallampati III , mucosa clear , drainage- none, tonsils- atrophic, + teeth Neck- flexible , trachea midline, no stridor , thyroid nl, carotid no bruit Chest - symmetrical excursion , unlabored           Heart/CV- RRR , no murmur , no gallop  , no rub, nl s1 s2                           - JVD- none , edema- none, stasis changes- none, varices- none           Lung- clear to P&A, wheeze- none, cough- none , dullness-none, rub- none           Chest wall-  Abd-  Br/ Gen/ Rectal- Not done, not indicated Extrem- cyanosis- none, clubbing, none, atrophy- none, strength- nl Neuro- grossly intact to observation

## 2019-10-28 NOTE — Patient Instructions (Signed)
Order- schedule HST   Dx OSA  Please call us about 2 weeks after your sleep test to see if results and recommendations are ready yet. If appropriate, we may be able to start treatment before we see you next.    

## 2019-10-29 ENCOUNTER — Encounter: Payer: Self-pay | Admitting: Internal Medicine

## 2019-10-29 DIAGNOSIS — R519 Headache, unspecified: Secondary | ICD-10-CM | POA: Insufficient documentation

## 2019-10-29 DIAGNOSIS — G4733 Obstructive sleep apnea (adult) (pediatric): Secondary | ICD-10-CM | POA: Insufficient documentation

## 2019-10-29 NOTE — Assessment & Plan Note (Signed)
She reports 40 lb weight gain in recent years.  May be a candidate for bariatric counseling.

## 2019-10-29 NOTE — Assessment & Plan Note (Signed)
High probability for OSA. Appropriate discussion including safe driving, testing, treatment options, importance of weight.  Plan- sleep study then likely a CPAP candidate.

## 2019-10-29 NOTE — Assessment & Plan Note (Signed)
Chronic difficult problem for her. It would be good if improved sleep issues could help.

## 2019-11-24 ENCOUNTER — Telehealth: Payer: Self-pay | Admitting: Internal Medicine

## 2019-11-24 NOTE — Telephone Encounter (Signed)
ATC pt, no answer. Left message for pt to call back.  Need a verbal from pt to send records.

## 2019-11-25 NOTE — Telephone Encounter (Signed)
LMTCB x 2 for the pt to ask permission [facility].

## 2019-11-26 ENCOUNTER — Telehealth: Payer: Self-pay | Admitting: Internal Medicine

## 2019-11-26 DIAGNOSIS — R0683 Snoring: Secondary | ICD-10-CM

## 2019-11-26 NOTE — Telephone Encounter (Signed)
Spoke with pt, advised her that I would fax records to [facility]. She also stated that she was not called to have her HST. While looking at her chart, I saw that the HST wasn't ordered. Order placed. PCC's can we move pt up on the list since it was our fault that it wasn't ordered. Thanks/

## 2019-11-26 NOTE — Telephone Encounter (Signed)
LMTCB X3 closing per protocol

## 2019-11-26 NOTE — Telephone Encounter (Signed)
No PA required for procedure code 78718 per Byrd Hesselbach at [facility]. Call Ref # [id number].  Order sent to [facility] to schedule. Rhonda J Cobb

## 2019-12-31 ENCOUNTER — Ambulatory Visit: Payer: Medicare Other

## 2019-12-31 ENCOUNTER — Other Ambulatory Visit: Payer: Self-pay

## 2019-12-31 DIAGNOSIS — R0683 Snoring: Secondary | ICD-10-CM

## 2019-12-31 DIAGNOSIS — G4733 Obstructive sleep apnea (adult) (pediatric): Secondary | ICD-10-CM

## 2020-01-05 DIAGNOSIS — G4733 Obstructive sleep apnea (adult) (pediatric): Secondary | ICD-10-CM | POA: Diagnosis not present

## 2020-02-10 ENCOUNTER — Ambulatory Visit (INDEPENDENT_AMBULATORY_CARE_PROVIDER_SITE_OTHER): Payer: Medicare Other | Admitting: Internal Medicine

## 2020-02-10 ENCOUNTER — Encounter: Payer: Self-pay | Admitting: Internal Medicine

## 2020-02-10 ENCOUNTER — Other Ambulatory Visit: Payer: Self-pay

## 2020-02-10 VITALS — BP 146/82 | HR 93 | Temp 98.0°F | Ht 64.0 in | Wt 200.8 lb

## 2020-02-10 DIAGNOSIS — J90 Pleural effusion, not elsewhere classified: Secondary | ICD-10-CM | POA: Diagnosis not present

## 2020-02-10 DIAGNOSIS — G4733 Obstructive sleep apnea (adult) (pediatric): Secondary | ICD-10-CM

## 2020-02-10 DIAGNOSIS — R0683 Snoring: Secondary | ICD-10-CM | POA: Diagnosis not present

## 2020-02-10 NOTE — Patient Instructions (Addendum)
Order- new DME, new CPAP, auto 5-15, mask of choice,humidifier, supplies, airView/ card  Please call if we can help

## 2020-02-10 NOTE — Progress Notes (Signed)
10/28/19- 84 yoF never smoker for sleep evaluation referred courtesy of Dr Neale Burly at [facility]. Medical problem list includes HTN, Chronic bilateral Pleural Effusions, Hypokalemia, DM2,  Report of a CPAP titration sleep study was provided, dated 10/28/2007 but a baseline diagnostic study was not included.  Epworth score 16 Body weight today 202 lbs Meds include Ventolin hfa, Symbicort 150, Flonase,     Fentanyl patch Hopes that better sleep will help headaches. Can't sleep well at night. Bedtime 11PM-1AM, Latency 1-2 hours, wakes 3 times before up at 9AM. Told of loud snoring. Falls asleep in conversation siting up in day and begins snoring. Witnessed apnea. Told she reaches and does "stuff with my hands" in sleep. No sleep meds and no caffeine except green tea. ENT- tonsils and sepetoplasty. DOE stairs and steps. Inhalers helps ome, but she denies cough or wheeze.  Weight gain 40 lbs recent years.  CTa chest 04/09/18-  IMPRESSION: No evidence of pulmonary embolus. Mild cardiomegaly. Trace bilateral effusions.  02/10/20- 73 yoF never smoker followed for OSA, complicated by HTN, Chronic bilateral Pleural Effusions, Hypokalemia, DM2 HST 01/01/20- AHI 9.8/ hr, desaturation to 82%, body weight 202 lbs Albuterol hfa, Symbicort 160, Flonase,  -----snoring,review sleep results, having increased headaches Body weight today 200 lbs Had 2 Phizer Covax We discussed results of sleep study, medical issues, treatment options.  Will start CPAP. >> auto 5-15  ROS-see HPI   + = positive Constitutional:    weight loss, night sweats, fevers, chills, fatigue, lassitude. HEENT:    +headaches, difficulty swallowing, +tooth/dental problems, sore throat,       +sneezing, itching, ear ache, +nasal congestion, post nasal drip, snoring CV:    chest pain, orthopnea, PND, swelling in lower extremities, anasarca,                                   dizziness, palpitations Resp:   +shortness of breath with  exertion or at rest.                productive cough,   non-productive cough, coughing up of blood.              change in color of mucus.  wheezing.   Skin:    rash or lesions. GI:  No-   heartburn, indigestion, abdominal pain, nausea, vomiting, diarrhea,                 change in bowel habits, loss of appetite GU: dysuria, change in color of urine, no urgency or frequency.   flank pain. MS:   joint pain, stiffness, decreased range of motion, back pain. Neuro-     nothing unusual Psych:  change in mood or affect.  depression or anxiety.   memory loss.  OBJ- Physical Exam General- Alert, Oriented, Affect-appropriate, Distress- none acute, + obese Skin- rash-none, lesions- none, excoriation- none     + Fentanyl patch on shoulder Lymphadenopathy- none Head- atraumatic            Eyes- Gross vision intact, PERRLA, conjunctivae and secretions clear            Ears- Hearing, canals-normal            Nose- Clear, no-Septal dev, mucus, polyps, erosion, perforation             Throat- Mallampati III , mucosa clear , drainage- none, tonsils- atrophic, + teeth Neck- flexible , trachea midline, no  stridor , thyroid nl, carotid no bruit Chest - symmetrical excursion , unlabored           Heart/CV- RRR , no murmur , no gallop  , no rub, nl s1 s2                           - JVD- none , edema- none, stasis changes- none, varices- none           Lung- clear to P&A, wheeze- none, cough- none , dullness-none, rub- none           Chest wall-  Abd-  Br/ Gen/ Rectal- Not done, not indicated Extrem- cyanosis- none, clubbing, none, atrophy- none, strength- nl Neuro- grossly intact to observation

## 2020-02-29 NOTE — Assessment & Plan Note (Signed)
Mild OSA. Discussed management options. Plan- Start CPAP, work on weight, safe driving

## 2020-02-29 NOTE — Assessment & Plan Note (Signed)
Minimal on CT chest 04/09/18

## 2020-05-12 ENCOUNTER — Ambulatory Visit: Payer: Medicare Other | Admitting: Internal Medicine

## 2020-05-14 ENCOUNTER — Encounter: Payer: Self-pay | Admitting: Internal Medicine

## 2020-05-15 ENCOUNTER — Ambulatory Visit (INDEPENDENT_AMBULATORY_CARE_PROVIDER_SITE_OTHER): Payer: Medicare Other | Admitting: Internal Medicine

## 2020-05-15 ENCOUNTER — Encounter: Payer: Self-pay | Admitting: Internal Medicine

## 2020-05-15 ENCOUNTER — Other Ambulatory Visit: Payer: Self-pay

## 2020-05-15 DIAGNOSIS — G4733 Obstructive sleep apnea (adult) (pediatric): Secondary | ICD-10-CM | POA: Diagnosis not present

## 2020-05-15 NOTE — Patient Instructions (Signed)
We can continue CPAP auto 5-15, mask of choice, humidifier, supplies, AirView/ card  I'm glad you found a mask style you like. Out goal will be to try to use CPAP all night, every night, and when ever you are sleeping.  Please cal if you have any questions or concerns we can help with.

## 2020-05-15 NOTE — Progress Notes (Signed)
HPI F never smoker followed for OSA, complicated by HTN, Chronic bilateral Pleural Effusions, Hypokalemia, DM2  HST 01/01/20- AHI 9.8/ hr, desaturation to 82%, body weight 202 lbs  ==========================================================  02/10/20- 73 yoF never smoker followed for OSA, complicated by HTN, Chronic bilateral Pleural Effusions, Hypokalemia, DM2  HST 01/01/20- AHI 9.8/ hr, desaturation to 82%, body weight 202 lbs Albuterol hfa, Symbicort 160, Flonase,  -----snoring,review sleep results, having increased headaches Body weight today 200 lbs Had 2 Phizer Covax We discussed results of sleep study, medical issues, treatment options.  Will start CPAP. >> auto 5-15  05/15/20- 74 yoF never smoker followed for OSA, complicated by HTN, Chronic bilateral Pleural Effusions, Hypokalemia, DM2  Albuterol hfa, Symbicort 160, Flonase,  CPAP auto 5-15/ [employer] Download- compliance 17%, AHI 8.5/ hr Body weight today- 200 lbs Covid vax- 3 Phizer Flu vax- had ------pt stated that she is doing well with her cpap now. Recorded compliance only 17%, with missed and short nights. Claims that she had to get used to it, trying several masks, but now "loves it". We reviewed download and discussed compliance goals and comfort issues.    ROS-see HPI   + = positive Constitutional:    weight loss, night sweats, fevers, chills, fatigue, lassitude. HEENT:    +headaches, difficulty swallowing, +tooth/dental problems, sore throat,       +sneezing, itching, ear ache, +nasal congestion, post nasal drip, snoring CV:    chest pain, orthopnea, PND, swelling in lower extremities, anasarca,                                   dizziness, palpitations Resp:   +shortness of breath with exertion or at rest.                productive cough,   non-productive cough, coughing up of blood.              change in color of mucus.  wheezing.   Skin:    rash or lesions. GI:  No-   heartburn, indigestion, abdominal pain, nausea,  vomiting, diarrhea,                 change in bowel habits, loss of appetite GU: dysuria, change in color of urine, no urgency or frequency.   flank pain. MS:   joint pain, stiffness, decreased range of motion, back pain. Neuro-     nothing unusual Psych:  change in mood or affect.  depression or anxiety.   memory loss.  OBJ- Physical Exam General- Alert, Oriented, Affect-appropriate, Distress- none acute, + obese Skin- rash-none, lesions- none, excoriation- none     + Fentanyl patch on shoulder Lymphadenopathy- none Head- atraumatic            Eyes- Gross vision intact, PERRLA, conjunctivae and secretions clear            Ears- Hearing, canals-normal            Nose- Clear, no-Septal dev, mucus, polyps, erosion, perforation             Throat- Mallampati III , mucosa clear , drainage- none, tonsils- atrophic, + teeth Neck- flexible , trachea midline, no stridor , thyroid nl, carotid no bruit Chest - symmetrical excursion , unlabored           Heart/CV- RRR , no murmur , no gallop  , no rub, nl s1 s2                           -  JVD- none , edema- none, stasis changes- none, varices- none           Lung- clear to P&A, wheeze- none, cough- none , dullness-none, rub- none           Chest wall-  Abd-  Br/ Gen/ Rectal- Not done, not indicated Extrem- cyanosis- none, clubbing, none, atrophy- none, strength- nl Neuro- grossly intact to observation

## 2020-06-14 ENCOUNTER — Telehealth: Payer: Self-pay | Admitting: Internal Medicine

## 2020-06-14 DIAGNOSIS — G4733 Obstructive sleep apnea (adult) (pediatric): Secondary | ICD-10-CM

## 2020-06-14 NOTE — Telephone Encounter (Signed)
ATC pt x 2. Line would ring then disconnect. WCB.  

## 2020-06-18 ENCOUNTER — Encounter: Payer: Self-pay | Admitting: Internal Medicine

## 2020-06-19 NOTE — Telephone Encounter (Signed)
Order- DME [employer]- please reduce autopap range to 5-12

## 2020-06-19 NOTE — Telephone Encounter (Signed)
Called and spoke with patient to let her know that Dr. Maple Hudson is going to send order over to [employer] to have settings on CPAP lowered to see if that helps with headaches. Patient expressed understanding. Order has been placed. Nothing further needed at this time.

## 2020-06-19 NOTE — Telephone Encounter (Signed)
Spoke with pt, states she is getting bad headaches, was prescribed an abx for a sinus infection for this by PCP, was advised by PCP to not wear cpap during this time.  Pt has since finished abx and is feeling better, but states that she feels that her pressure is too high on her cpap.  Pt is requesting an order to DME to lower her pressure.  Pt tried to get this done through PCP but was advised to contact our office for this instead.    Download from [employer].  CY please advise- thanks!

## 2020-06-20 ENCOUNTER — Encounter: Payer: Self-pay | Admitting: Internal Medicine

## 2020-06-20 NOTE — Assessment & Plan Note (Signed)
Optimistic that she can use CCPAP successfully and benefit. Education done. Alternatives reviewed.  Plan- continue auto 5-15

## 2020-06-20 NOTE — Assessment & Plan Note (Signed)
Emphasize life style change to accomplish weight loss.

## 2020-06-21 DIAGNOSIS — M170 Bilateral primary osteoarthritis of knee: Secondary | ICD-10-CM | POA: Diagnosis not present

## 2020-06-21 DIAGNOSIS — R0609 Other forms of dyspnea: Secondary | ICD-10-CM | POA: Diagnosis not present

## 2020-06-21 DIAGNOSIS — M25561 Pain in right knee: Secondary | ICD-10-CM | POA: Diagnosis not present

## 2020-06-21 DIAGNOSIS — G4733 Obstructive sleep apnea (adult) (pediatric): Secondary | ICD-10-CM | POA: Diagnosis not present

## 2020-06-21 DIAGNOSIS — M25562 Pain in left knee: Secondary | ICD-10-CM | POA: Diagnosis not present

## 2020-06-22 ENCOUNTER — Telehealth: Payer: Self-pay

## 2020-06-22 NOTE — Telephone Encounter (Signed)
NOTES ON FILE FROM [facility] [phone], SENT REFERRAL TO SCHEDULING 

## 2020-06-29 DIAGNOSIS — K219 Gastro-esophageal reflux disease without esophagitis: Secondary | ICD-10-CM | POA: Diagnosis not present

## 2020-06-29 DIAGNOSIS — E1159 Type 2 diabetes mellitus with other circulatory complications: Secondary | ICD-10-CM | POA: Diagnosis not present

## 2020-06-29 DIAGNOSIS — J4540 Moderate persistent asthma, uncomplicated: Secondary | ICD-10-CM | POA: Diagnosis not present

## 2020-06-29 DIAGNOSIS — G43009 Migraine without aura, not intractable, without status migrainosus: Secondary | ICD-10-CM | POA: Diagnosis not present

## 2020-06-29 DIAGNOSIS — E119 Type 2 diabetes mellitus without complications: Secondary | ICD-10-CM | POA: Diagnosis not present

## 2020-06-29 DIAGNOSIS — E1140 Type 2 diabetes mellitus with diabetic neuropathy, unspecified: Secondary | ICD-10-CM | POA: Diagnosis not present

## 2020-06-29 DIAGNOSIS — D509 Iron deficiency anemia, unspecified: Secondary | ICD-10-CM | POA: Diagnosis not present

## 2020-06-29 DIAGNOSIS — J4520 Mild intermittent asthma, uncomplicated: Secondary | ICD-10-CM | POA: Diagnosis not present

## 2020-06-29 DIAGNOSIS — E039 Hypothyroidism, unspecified: Secondary | ICD-10-CM | POA: Diagnosis not present

## 2020-06-29 DIAGNOSIS — I10 Essential (primary) hypertension: Secondary | ICD-10-CM | POA: Diagnosis not present

## 2020-07-05 DIAGNOSIS — Z79891 Long term (current) use of opiate analgesic: Secondary | ICD-10-CM | POA: Diagnosis not present

## 2020-07-05 DIAGNOSIS — G894 Chronic pain syndrome: Secondary | ICD-10-CM | POA: Diagnosis not present

## 2020-07-05 DIAGNOSIS — M170 Bilateral primary osteoarthritis of knee: Secondary | ICD-10-CM | POA: Diagnosis not present

## 2020-07-05 DIAGNOSIS — M47816 Spondylosis without myelopathy or radiculopathy, lumbar region: Secondary | ICD-10-CM | POA: Diagnosis not present

## 2020-07-05 DIAGNOSIS — M961 Postlaminectomy syndrome, not elsewhere classified: Secondary | ICD-10-CM | POA: Diagnosis not present

## 2020-07-12 DIAGNOSIS — M170 Bilateral primary osteoarthritis of knee: Secondary | ICD-10-CM | POA: Diagnosis not present

## 2020-07-12 DIAGNOSIS — G4733 Obstructive sleep apnea (adult) (pediatric): Secondary | ICD-10-CM | POA: Diagnosis not present

## 2020-07-19 ENCOUNTER — Other Ambulatory Visit: Payer: Self-pay

## 2020-07-19 ENCOUNTER — Ambulatory Visit (INDEPENDENT_AMBULATORY_CARE_PROVIDER_SITE_OTHER): Payer: Medicare Other | Admitting: Interventional Cardiology

## 2020-07-19 ENCOUNTER — Encounter: Payer: Self-pay | Admitting: Interventional Cardiology

## 2020-07-19 VITALS — BP 140/88 | HR 75 | Ht 64.0 in | Wt 204.0 lb

## 2020-07-19 DIAGNOSIS — R0609 Other forms of dyspnea: Secondary | ICD-10-CM

## 2020-07-19 DIAGNOSIS — R0600 Dyspnea, unspecified: Secondary | ICD-10-CM

## 2020-07-19 DIAGNOSIS — E669 Obesity, unspecified: Secondary | ICD-10-CM

## 2020-07-19 DIAGNOSIS — E119 Type 2 diabetes mellitus without complications: Secondary | ICD-10-CM | POA: Diagnosis not present

## 2020-07-19 DIAGNOSIS — R072 Precordial pain: Secondary | ICD-10-CM | POA: Diagnosis not present

## 2020-07-19 MED ORDER — METOPROLOL TARTRATE 50 MG PO TABS: ORAL_TABLET | ORAL | 0 refills | Status: DC

## 2020-07-19 NOTE — Patient Instructions (Addendum)
Medication Instructions:  Your physician recommends that you continue on your current medications as directed. Please refer to the Current Medication list given to you today.  *If you need a refill on your cardiac medications before your next appointment, please call your pharmacy*   Lab Work: NONE If you have labs (blood work) drawn today and your tests are completely normal, you will receive your results only by: Marland Kitchen MyChart Message (if you have MyChart) OR . A paper copy in the mail If you have any lab test that is abnormal or we need to change your treatment, we will call you to review the results.   Testing/Procedures: Your physician has requested that you have cardiac CT. Cardiac computed tomography (CT) is a painless test that uses an x-ray machine to take clear, detailed pictures of your heart.       Follow-Up: Follow up based on results of Cardiac CT test At [facility], you and your health needs are our priority.  As part of our continuing mission to provide you with exceptional heart care, we have created designated Provider Care Teams.  These Care Teams include your primary Cardiologist (physician) and Advanced Practice Providers (APPs -  Physician Assistants and Nurse Practitioners) who all work together to provide you with the care you need, when you need it.      Other Instructions Your cardiac CT will be scheduled at one of the below locations:   [facility] [address] [place], [place] [address] [phone]  [place] [address] [place], [place] [address] [phone]  If scheduled at [facility], please arrive at the [facility] main entrance of [facility] 30 minutes prior to test start time. Proceed to the [facility] Radiology Department (first floor) to check-in and test prep.  If scheduled at [facility], please arrive 15 mins early  for check-in and test prep.  Please follow these instructions carefully (unless otherwise directed):   On the Night Before the Test: . Be sure to Drink plenty of water. . Do not consume any caffeinated/decaffeinated beverages or chocolate 12 hours prior to your test. . Do not take any antihistamines 12 hours prior to your test. Do not take your claritin 12 hours prior to test.   On the Day of the Test: . Drink plenty of water. Do not drink any water within one hour of the test. . Do not eat any food 4 hours prior to the test. . You may take your regular medications prior to the test.  . Take metoprolol (Lopressor) 50mg   two hours prior to test. . FEMALES- please wear underwire-free bra if available         After the Test: . Drink plenty of water. . After receiving IV contrast, you may experience a mild flushed feeling. This is normal. . On occasion, you may experience a mild rash up to 24 hours after the test. This is not dangerous. If this occurs, you can take Benadryl 25 mg and increase your fluid intake. . If you experience trouble breathing, this can be serious. If it is severe call 911 IMMEDIATELY. If it is mild, please call our office.    Once we have confirmed authorization from your insurance company, we will call you to set up a date and time for your test. Based on how quickly your insurance processes prior authorizations requests, please allow up to 4 weeks to be  contacted for scheduling your Cardiac CT appointment. Be advised that routine Cardiac CT appointments could be scheduled as many as 8 weeks after your provider has ordered it.  For non-scheduling related questions, please contact the cardiac imaging nurse navigator should you have any questions/concerns: Marchia Bond, Cardiac Imaging Nurse Navigator Burley Saver, Interim Cardiac Imaging Nurse [place] [facility] Direct Office Dial: [phone]   For scheduling needs, including  cancellations and rescheduling, please call [place], [phone].

## 2020-07-19 NOTE — Progress Notes (Signed)
Cardiology Office Note   Date:  07/19/2020   ID:  Melinda Sanford, Melinda Sanford 12, 1947, MRN [id number]  PCP:  Marden Noble, MD    No chief complaint on file.  DOE  [employer] Readings from Last 3 Encounters:  07/19/20 204 lb (92.5 kg)  05/15/20 200 lb 6 oz (90.9 kg)  02/10/20 200 lb 12.8 oz (91.1 kg)       History of Present Illness: Melinda Sanford is a 75 y.o. female who is being seen today for the evaluation of DOE at the request of Marden Noble, MD.  She did have a 2-D echocardiogram in 2010 and 2019 that revealed an ejection fraction of 65% and no wall motion abnormalities.  She had a normal stress test in 2014.  She has noticed DOE getting worse over the past few months.  SHe thought it was asthma but has not responded to inhalers.  She had been doing water aerobics regularly, but she is slowing down and ca no longer keep up with th erest of the class.  She notes some chest pressure as well when she has SHOB.     No early CAD.  Siblings are healthy.     Past Medical History:  Diagnosis Date  . Allergic rhinitis   . Anxiety   . Asthma   . Brachial neuritis or radiculitis NOS   . DJD (degenerative joint disease)    of the knees, right greater than left - Dr. Hardin Negus, May, 2011  . GERD (gastroesophageal reflux disease)   . Hypertension   . Hyposmolality and/or hyponatremia   . Iron deficiency anemia, unspecified   . Pain in joint   . Pre-diabetes     Past Surgical History:  Procedure Laterality Date  . back surgery - Dr. Jeral Fruit    . KNEE SURGERY  01/2018  . lumbar surgery, Dr. Donalee Citrin, 2012    . right knee arthroscopy - Dr. Dion Saucier 2010/2011    . TOTAL ABDOMINAL HYSTERECTOMY W/ BILATERAL SALPINGOOPHORECTOMY       Current Outpatient Medications  Medication Sig Dispense Refill  . albuterol (VENTOLIN HFA) 108 (90 Base) MCG/ACT inhaler SMARTSIG:2 Puff(s) By Mouth Every 4 Hours PRN    . budesonide-formoterol (SYMBICORT) 160-4.5 MCG/ACT inhaler Inhale 2  puffs into the lungs 2 (two) times daily as needed (wheezing/shortness of breath).     . cholecalciferol (VITAMIN D) 1000 UNITS tablet Take 1,000 Units by mouth daily.    . citalopram (CELEXA) 20 MG tablet Take 20 mg by mouth daily.    . cyclobenzaprine (FLEXERIL) 10 MG tablet Take 10 mg by mouth 3 (three) times daily as needed for muscle spasms.    Marland Kitchen estradiol (ESTRACE) 1 MG tablet Take 1 mg by mouth daily.     . fentaNYL (DURAGESIC - DOSED MCG/HR) 12 MCG/HR Place 1 patch onto the skin every 3 (three) days.    . ferrous sulfate 325 (65 FE) MG tablet Take by mouth.    . fluticasone (FLONASE) 50 MCG/ACT nasal spray Place into the nose.    . loratadine (CLARITIN) 10 MG tablet Take 10 mg by mouth daily.    . mirabegron ER (MYRBETRIQ) 25 MG TB24 tablet Take by mouth.    . pantoprazole (PROTONIX) 20 MG tablet Take 20 mg by mouth daily.    . promethazine (PHENERGAN) 25 MG tablet Take 25 mg by mouth daily as needed for nausea or vomiting.     . topiramate (TOPAMAX) 100 MG tablet Take 100  mg by mouth 2 (two) times daily.     No current facility-administered medications for this visit.    Allergies:   Asa [aspirin], Atenolol, Codeine, Doxycycline, Dyazide [hydrochlorothiazide w-triamterene], Erythromycin base, Lactulose, Levofloxacin, Phenergan [promethazine], Septra [sulfamethoxazole-trimethoprim], Sulfamethoxazole-trimethoprim, Verapamil, and Zolpidem    Social History:  The patient  reports that she has never smoked. She has never used smokeless tobacco. She reports that she does not drink alcohol and does not use drugs.   Family History:  The patient's family history includes Colon cancer in her father and mother.    ROS:  Please see the history of present illness.   Otherwise, review of systems are positive for DOE.   All other systems are reviewed and negative.    PHYSICAL EXAM: VS:  BP 140/88   Pulse 75   Ht 5\' 4"  (1.626 m)   Wt 204 lb (92.5 kg)   SpO2 97%   BMI 35.02 kg/m  , BMI  Body mass index is 35.02 kg/m. GEN: Well nourished, well developed, in no acute distress  HEENT: normal  Neck: no JVD, carotid bruits, or masses Cardiac: RRR; no murmurs, rubs, or gallops,no edema  Respiratory:  clear to auscultation bilaterally, normal work of breathing GI: soft, nontender, nondistended, + BS, obese MS: no deformity or atrophy  Skin: warm and dry, no rash Neuro:  Strength and sensation are intact Psych: euthymic mood, full affect   EKG:   The ekg ordered today demonstrates NSR, nonspecific CT changes   Recent Labs: No results found for requested labs within last 8760 hours.   Lipid Panel    Component Value Date/Time   CHOL 175 04/10/2018 0630   TRIG 60 04/10/2018 0630   HDL 95 04/10/2018 0630   CHOLHDL 1.8 04/10/2018 0630   VLDL 12 04/10/2018 0630   LDLCALC 68 04/10/2018 0630     Other studies Reviewed: Additional studies/ records that were reviewed today with results demonstrating: prior records reviewed.   ASSESSMENT AND PLAN:  1. Precordial CP/ DOE: Given RF for CAD, plan for CTA coronaries.  Metoprolol 50 mg prior to test.   2. DM: A1C 6.8. Whole food, plant based diet.  Well controlled.  3. Back pain: Seems unrelated to her shortness of breath.  4. Obesity: Hopefully can get back to regular exercise when [facility] cause determined.   Current medicines are reviewed at length with the patient today.  The patient concerns regarding her medicines were addressed.  The following changes have been made:  No change  Labs/ tests ordered today include:  No orders of the defined types were placed in this encounter.   Recommend 150 minutes/week of aerobic exercise Low fat, low carb, high fiber diet recommended  Disposition:   FU for scan   Signed, [facility], MD  07/19/2020 3:00 PM    [facility] [address] [place], [place], [place]  [place] Phone: [phone]; Fax: [phone]

## 2020-07-21 DIAGNOSIS — M170 Bilateral primary osteoarthritis of knee: Secondary | ICD-10-CM | POA: Diagnosis not present

## 2020-07-25 DIAGNOSIS — G4733 Obstructive sleep apnea (adult) (pediatric): Secondary | ICD-10-CM | POA: Diagnosis not present

## 2020-08-02 ENCOUNTER — Other Ambulatory Visit: Payer: Self-pay

## 2020-08-02 ENCOUNTER — Other Ambulatory Visit: Payer: Medicare Other

## 2020-08-02 DIAGNOSIS — J4540 Moderate persistent asthma, uncomplicated: Secondary | ICD-10-CM | POA: Diagnosis not present

## 2020-08-02 DIAGNOSIS — H1032 Unspecified acute conjunctivitis, left eye: Secondary | ICD-10-CM | POA: Diagnosis not present

## 2020-08-02 DIAGNOSIS — G43009 Migraine without aura, not intractable, without status migrainosus: Secondary | ICD-10-CM | POA: Diagnosis not present

## 2020-08-02 DIAGNOSIS — R072 Precordial pain: Secondary | ICD-10-CM | POA: Diagnosis not present

## 2020-08-02 DIAGNOSIS — I10 Essential (primary) hypertension: Secondary | ICD-10-CM | POA: Diagnosis not present

## 2020-08-02 DIAGNOSIS — E1159 Type 2 diabetes mellitus with other circulatory complications: Secondary | ICD-10-CM | POA: Diagnosis not present

## 2020-08-02 DIAGNOSIS — J4520 Mild intermittent asthma, uncomplicated: Secondary | ICD-10-CM | POA: Diagnosis not present

## 2020-08-02 DIAGNOSIS — K219 Gastro-esophageal reflux disease without esophagitis: Secondary | ICD-10-CM | POA: Diagnosis not present

## 2020-08-02 DIAGNOSIS — D509 Iron deficiency anemia, unspecified: Secondary | ICD-10-CM | POA: Diagnosis not present

## 2020-08-02 DIAGNOSIS — E1140 Type 2 diabetes mellitus with diabetic neuropathy, unspecified: Secondary | ICD-10-CM | POA: Diagnosis not present

## 2020-08-02 DIAGNOSIS — M170 Bilateral primary osteoarthritis of knee: Secondary | ICD-10-CM | POA: Diagnosis not present

## 2020-08-02 DIAGNOSIS — R0609 Other forms of dyspnea: Secondary | ICD-10-CM

## 2020-08-02 DIAGNOSIS — E119 Type 2 diabetes mellitus without complications: Secondary | ICD-10-CM | POA: Diagnosis not present

## 2020-08-02 DIAGNOSIS — R0600 Dyspnea, unspecified: Secondary | ICD-10-CM

## 2020-08-02 DIAGNOSIS — E039 Hypothyroidism, unspecified: Secondary | ICD-10-CM | POA: Diagnosis not present

## 2020-08-02 LAB — BASIC METABOLIC PANEL
BUN/Creatinine Ratio: 10 — ABNORMAL LOW (ref 12–28)
BUN: 8 mg/dL (ref 8–27)
CO2: 20 mmol/L (ref 20–29)
Calcium: 9.3 mg/dL (ref 8.7–10.3)
Chloride: 106 mmol/L (ref 96–106)
Creatinine, Ser: 0.83 mg/dL (ref 0.57–1.00)
GFR calc Af Amer: 81 mL/min/{1.73_m2} (ref >59)
GFR calc non Af Amer: 71 mL/min/{1.73_m2} (ref >59)
Glucose: 152 mg/dL — ABNORMAL HIGH (ref 65–99)
Potassium: 4 mmol/L (ref 3.5–5.2)
Sodium: 140 mmol/L (ref 134–144)

## 2020-08-03 ENCOUNTER — Telehealth (HOSPITAL_COMMUNITY): Payer: Self-pay | Admitting: Emergency Medicine

## 2020-08-03 NOTE — Telephone Encounter (Signed)
Reaching out to patient to offer assistance regarding upcoming cardiac imaging study; pt verbalizes understanding of appt date/time, parking situation and where to check in, pre-test NPO status and medications ordered, and verified current allergies; name and call back number provided for further questions should they arise Rockwell Alexandria RN Navigator Cardiac Imaging [facility] [phone] office [phone] cell  50mg  metoprolol tartrate PTA 

## 2020-08-07 ENCOUNTER — Other Ambulatory Visit: Payer: Self-pay

## 2020-08-07 ENCOUNTER — Encounter: Payer: Medicare Other | Admitting: *Deleted

## 2020-08-07 ENCOUNTER — Ambulatory Visit (HOSPITAL_COMMUNITY)
Admission: RE | Admit: 2020-08-07 | Discharge: 2020-08-07 | Disposition: A | Discharge status: Home / Self Care | Payer: Medicare Other | Source: Ambulatory Visit | Attending: Interventional Cardiology | Admitting: Interventional Cardiology

## 2020-08-07 DIAGNOSIS — R0609 Other forms of dyspnea: Secondary | ICD-10-CM

## 2020-08-07 DIAGNOSIS — Z006 Encounter for examination for normal comparison and control in clinical research program: Secondary | ICD-10-CM

## 2020-08-07 DIAGNOSIS — R0600 Dyspnea, unspecified: Secondary | ICD-10-CM | POA: Insufficient documentation

## 2020-08-07 DIAGNOSIS — E119 Type 2 diabetes mellitus without complications: Secondary | ICD-10-CM | POA: Diagnosis not present

## 2020-08-07 DIAGNOSIS — R072 Precordial pain: Secondary | ICD-10-CM | POA: Insufficient documentation

## 2020-08-07 MED ORDER — IOHEXOL 350 MG/ML SOLN
80.0000 mL | Freq: Once | INTRAVENOUS | Status: AC | PRN
  Administered 2020-08-07: 80 mL via INTRAVENOUS

## 2020-08-07 MED ORDER — NITROGLYCERIN 0.4 MG SL SUBL
0.8000 mg | SUBLINGUAL_TABLET | Freq: Once | SUBLINGUAL | Status: AC
  Administered 2020-08-07: 0.8 mg via SUBLINGUAL

## 2020-08-07 MED ORDER — NITROGLYCERIN 0.4 MG SL SUBL
SUBLINGUAL_TABLET | SUBLINGUAL | Status: AC
  Filled 2020-08-07: qty 2

## 2020-08-07 NOTE — Research (Signed)
IDENTIFY  Informed Consent   Subject Name: Melinda Sanford  Subject met inclusion and exclusion criteria.  The informed consent form, study requirements and expectations were reviewed with the subject and questions and concerns were addressed prior to the signing of the consent form.  The subject verbalized understanding of the trial requirements.  The subject agreed to participate in the IDENTIFY trial and signed the informed consent at [place] on 08/07/2020.  The informed consent was obtained prior to performance of any protocol-specific procedures for the subject.  A copy of the signed informed consent was given to the subject and a copy was placed in the subject's medical record.   Philemon Kingdom D

## 2020-08-12 DIAGNOSIS — G4733 Obstructive sleep apnea (adult) (pediatric): Secondary | ICD-10-CM | POA: Diagnosis not present

## 2020-08-30 DIAGNOSIS — M47816 Spondylosis without myelopathy or radiculopathy, lumbar region: Secondary | ICD-10-CM | POA: Diagnosis not present

## 2020-08-30 DIAGNOSIS — G894 Chronic pain syndrome: Secondary | ICD-10-CM | POA: Diagnosis not present

## 2020-08-30 DIAGNOSIS — M961 Postlaminectomy syndrome, not elsewhere classified: Secondary | ICD-10-CM | POA: Diagnosis not present

## 2020-08-30 DIAGNOSIS — Z79891 Long term (current) use of opiate analgesic: Secondary | ICD-10-CM | POA: Diagnosis not present

## 2020-09-11 DIAGNOSIS — M542 Cervicalgia: Secondary | ICD-10-CM | POA: Diagnosis not present

## 2020-09-11 DIAGNOSIS — G43719 Chronic migraine without aura, intractable, without status migrainosus: Secondary | ICD-10-CM | POA: Diagnosis not present

## 2020-09-11 DIAGNOSIS — M791 Myalgia, unspecified site: Secondary | ICD-10-CM | POA: Diagnosis not present

## 2020-09-12 DIAGNOSIS — D509 Iron deficiency anemia, unspecified: Secondary | ICD-10-CM | POA: Diagnosis not present

## 2020-09-12 DIAGNOSIS — J4520 Mild intermittent asthma, uncomplicated: Secondary | ICD-10-CM | POA: Diagnosis not present

## 2020-09-12 DIAGNOSIS — J4540 Moderate persistent asthma, uncomplicated: Secondary | ICD-10-CM | POA: Diagnosis not present

## 2020-09-12 DIAGNOSIS — I10 Essential (primary) hypertension: Secondary | ICD-10-CM | POA: Diagnosis not present

## 2020-09-12 DIAGNOSIS — G43009 Migraine without aura, not intractable, without status migrainosus: Secondary | ICD-10-CM | POA: Diagnosis not present

## 2020-09-12 DIAGNOSIS — E1140 Type 2 diabetes mellitus with diabetic neuropathy, unspecified: Secondary | ICD-10-CM | POA: Diagnosis not present

## 2020-09-12 DIAGNOSIS — E119 Type 2 diabetes mellitus without complications: Secondary | ICD-10-CM | POA: Diagnosis not present

## 2020-09-12 DIAGNOSIS — E1159 Type 2 diabetes mellitus with other circulatory complications: Secondary | ICD-10-CM | POA: Diagnosis not present

## 2020-09-12 DIAGNOSIS — E039 Hypothyroidism, unspecified: Secondary | ICD-10-CM | POA: Diagnosis not present

## 2020-09-12 DIAGNOSIS — M170 Bilateral primary osteoarthritis of knee: Secondary | ICD-10-CM | POA: Diagnosis not present

## 2020-09-12 DIAGNOSIS — K219 Gastro-esophageal reflux disease without esophagitis: Secondary | ICD-10-CM | POA: Diagnosis not present

## 2020-09-13 NOTE — Progress Notes (Signed)
HPI F never smoker followed for OSA, complicated by HTN, Chronic bilateral Pleural Effusions, Hypokalemia, DM2  HST 01/01/20- AHI 9.8/ hr, desaturation to 82%, body weight 202 lbs  ==========================================================   05/15/20- 74 yoF never smoker followed for OSA, complicated by HTN, Chronic bilateral Pleural Effusions, Hypokalemia, DM2  Albuterol hfa, Symbicort 160, Flonase,  CPAP auto 5-15/ [employer] Download- compliance 17%, AHI 8.5/ hr Body weight today- 200 lbs Covid vax- 3 Phizer Flu vax- had ------pt stated that she is doing well with her cpap now. Recorded compliance only 17%, with missed and short nights. Claims that she had to get used to it, trying several masks, but now "loves it". We reviewed download and discussed compliance goals and comfort issues.   09/14/20- 74 yoF never smoker followed for OSA, complicated by HTN, Chronic bilateral Pleural Effusions, Hypokalemia, DM2  Albuterol hfa, Symbicort 160, Flonase,  CPAP auto 5-15/ [employer] Download-compliance  100%, AHI 6/ hr Body weight today-204 lbs Covid vax-3 Phizer Flu vax-had -----Patient states that she is feeling ok overall, sleeping good and machine is working good.  She switched to medium nasal pillows and comfortable with that. Sleeping well. Reviewed download. Using CPAP has stopped morning headaches for which she was taking Topamax.  Getting bills she doesn't understand from [employer]. She works with [employer]. I suggested she make appt to talk directly with manager at the [place] office. Uses chair lift at home for arthritis knees. Water aerobics.  ROS-see HPI   + = positive Constitutional:    weight loss, night sweats, fevers, chills, fatigue, lassitude. HEENT:    +headaches, difficulty swallowing, +tooth/dental problems, sore throat,       +sneezing, itching, ear ache, +nasal congestion, post nasal drip, snoring CV:    chest pain, orthopnea, PND, swelling in lower extremities,  anasarca,                                   dizziness, palpitations Resp:   +shortness of breath with exertion or at rest.                productive cough,   non-productive cough, coughing up of blood.              change in color of mucus.  wheezing.   Skin:    rash or lesions. GI:  No-   heartburn, indigestion, abdominal pain, nausea, vomiting, diarrhea,                 change in bowel habits, loss of appetite GU: dysuria, change in color of urine, no urgency or frequency.   flank pain. MS:  + joint pain, stiffness, decreased range of motion, back pain. Neuro-     nothing unusual Psych:  change in mood or affect.  depression or anxiety.   memory loss.  OBJ- Physical Exam General- Alert, Oriented, Affect-appropriate, Distress- none acute, + obese Skin- rash-none, lesions- none, excoriation- none    Lymphadenopathy- none Head- atraumatic            Eyes- Gross vision intact, PERRLA, conjunctivae and secretions clear            Ears- Hearing, canals-normal            Nose- Clear, no-Septal dev, mucus, polyps, erosion, perforation             Throat- Mallampati III , mucosa clear , drainage- none, tonsils- atrophic, + teeth Neck- flexible ,  trachea midline, no stridor , thyroid nl, carotid no bruit Chest - symmetrical excursion , unlabored           Heart/CV- RRR , no murmur , no gallop  , no rub, nl s1 s2                           - JVD- none , edema- none, stasis changes- none, varices- none           Lung- clear to P&A, wheeze- none, cough- none , dullness-none, rub- none           Chest wall-  Abd-  Br/ Gen/ Rectal- Not done, not indicated Extrem- cyanosis- none, clubbing, none, atrophy- none, strength- nl Neuro- grossly intact to observation

## 2020-09-14 ENCOUNTER — Other Ambulatory Visit: Payer: Self-pay

## 2020-09-14 ENCOUNTER — Ambulatory Visit (INDEPENDENT_AMBULATORY_CARE_PROVIDER_SITE_OTHER): Payer: Medicare Other | Admitting: Internal Medicine

## 2020-09-14 ENCOUNTER — Encounter: Payer: Self-pay | Admitting: Internal Medicine

## 2020-09-14 DIAGNOSIS — G4733 Obstructive sleep apnea (adult) (pediatric): Secondary | ICD-10-CM | POA: Diagnosis not present

## 2020-09-14 DIAGNOSIS — G4459 Other complicated headache syndrome: Secondary | ICD-10-CM | POA: Diagnosis not present

## 2020-09-14 NOTE — Assessment & Plan Note (Signed)
Benefits from CPAP with good compliance and adequate control Plan- continue auto 5- 15

## 2020-09-14 NOTE — Patient Instructions (Signed)
We can continue CPAP auto 5-15 ° °Please call if we can help °

## 2020-09-14 NOTE — Assessment & Plan Note (Signed)
She says with regular use of CPAP, her morning headaches have stopped.

## 2020-09-27 DIAGNOSIS — M170 Bilateral primary osteoarthritis of knee: Secondary | ICD-10-CM | POA: Diagnosis not present

## 2020-10-03 DIAGNOSIS — E039 Hypothyroidism, unspecified: Secondary | ICD-10-CM | POA: Diagnosis not present

## 2020-10-03 DIAGNOSIS — Z79899 Other long term (current) drug therapy: Secondary | ICD-10-CM | POA: Diagnosis not present

## 2020-10-03 DIAGNOSIS — K219 Gastro-esophageal reflux disease without esophagitis: Secondary | ICD-10-CM | POA: Diagnosis not present

## 2020-10-03 DIAGNOSIS — E1159 Type 2 diabetes mellitus with other circulatory complications: Secondary | ICD-10-CM | POA: Diagnosis not present

## 2020-10-03 DIAGNOSIS — E1140 Type 2 diabetes mellitus with diabetic neuropathy, unspecified: Secondary | ICD-10-CM | POA: Diagnosis not present

## 2020-10-03 DIAGNOSIS — E119 Type 2 diabetes mellitus without complications: Secondary | ICD-10-CM | POA: Diagnosis not present

## 2020-10-03 DIAGNOSIS — I10 Essential (primary) hypertension: Secondary | ICD-10-CM | POA: Diagnosis not present

## 2020-10-03 DIAGNOSIS — G43009 Migraine without aura, not intractable, without status migrainosus: Secondary | ICD-10-CM | POA: Diagnosis not present

## 2020-10-03 DIAGNOSIS — J4520 Mild intermittent asthma, uncomplicated: Secondary | ICD-10-CM | POA: Diagnosis not present

## 2020-10-03 DIAGNOSIS — D509 Iron deficiency anemia, unspecified: Secondary | ICD-10-CM | POA: Diagnosis not present

## 2020-10-03 DIAGNOSIS — J4540 Moderate persistent asthma, uncomplicated: Secondary | ICD-10-CM | POA: Diagnosis not present

## 2020-10-03 DIAGNOSIS — Z0000 Encounter for general adult medical examination without abnormal findings: Secondary | ICD-10-CM | POA: Diagnosis not present

## 2020-10-09 ENCOUNTER — Other Ambulatory Visit: Payer: Self-pay | Admitting: Internal Medicine

## 2020-10-09 DIAGNOSIS — Z13820 Encounter for screening for osteoporosis: Secondary | ICD-10-CM

## 2020-10-10 DIAGNOSIS — G4733 Obstructive sleep apnea (adult) (pediatric): Secondary | ICD-10-CM | POA: Diagnosis not present

## 2020-10-16 DIAGNOSIS — M546 Pain in thoracic spine: Secondary | ICD-10-CM | POA: Diagnosis not present

## 2020-10-20 DIAGNOSIS — I10 Essential (primary) hypertension: Secondary | ICD-10-CM | POA: Diagnosis not present

## 2020-10-20 DIAGNOSIS — E1140 Type 2 diabetes mellitus with diabetic neuropathy, unspecified: Secondary | ICD-10-CM | POA: Diagnosis not present

## 2020-10-23 DIAGNOSIS — G4733 Obstructive sleep apnea (adult) (pediatric): Secondary | ICD-10-CM | POA: Diagnosis not present

## 2020-10-25 DIAGNOSIS — Z79891 Long term (current) use of opiate analgesic: Secondary | ICD-10-CM | POA: Diagnosis not present

## 2020-10-25 DIAGNOSIS — M546 Pain in thoracic spine: Secondary | ICD-10-CM | POA: Diagnosis not present

## 2020-10-25 DIAGNOSIS — M5414 Radiculopathy, thoracic region: Secondary | ICD-10-CM | POA: Diagnosis not present

## 2020-10-25 DIAGNOSIS — M542 Cervicalgia: Secondary | ICD-10-CM | POA: Diagnosis not present

## 2020-10-25 DIAGNOSIS — G894 Chronic pain syndrome: Secondary | ICD-10-CM | POA: Diagnosis not present

## 2020-10-25 DIAGNOSIS — G43719 Chronic migraine without aura, intractable, without status migrainosus: Secondary | ICD-10-CM | POA: Diagnosis not present

## 2020-10-25 DIAGNOSIS — M791 Myalgia, unspecified site: Secondary | ICD-10-CM | POA: Diagnosis not present

## 2020-10-31 DIAGNOSIS — E039 Hypothyroidism, unspecified: Secondary | ICD-10-CM | POA: Diagnosis not present

## 2020-10-31 DIAGNOSIS — M170 Bilateral primary osteoarthritis of knee: Secondary | ICD-10-CM | POA: Diagnosis not present

## 2020-10-31 DIAGNOSIS — D509 Iron deficiency anemia, unspecified: Secondary | ICD-10-CM | POA: Diagnosis not present

## 2020-10-31 DIAGNOSIS — K219 Gastro-esophageal reflux disease without esophagitis: Secondary | ICD-10-CM | POA: Diagnosis not present

## 2020-10-31 DIAGNOSIS — G43009 Migraine without aura, not intractable, without status migrainosus: Secondary | ICD-10-CM | POA: Diagnosis not present

## 2020-10-31 DIAGNOSIS — E1159 Type 2 diabetes mellitus with other circulatory complications: Secondary | ICD-10-CM | POA: Diagnosis not present

## 2020-10-31 DIAGNOSIS — J4540 Moderate persistent asthma, uncomplicated: Secondary | ICD-10-CM | POA: Diagnosis not present

## 2020-10-31 DIAGNOSIS — E119 Type 2 diabetes mellitus without complications: Secondary | ICD-10-CM | POA: Diagnosis not present

## 2020-10-31 DIAGNOSIS — I10 Essential (primary) hypertension: Secondary | ICD-10-CM | POA: Diagnosis not present

## 2020-10-31 DIAGNOSIS — E1140 Type 2 diabetes mellitus with diabetic neuropathy, unspecified: Secondary | ICD-10-CM | POA: Diagnosis not present

## 2020-11-01 DIAGNOSIS — M170 Bilateral primary osteoarthritis of knee: Secondary | ICD-10-CM | POA: Diagnosis not present

## 2020-11-06 DIAGNOSIS — M25662 Stiffness of left knee, not elsewhere classified: Secondary | ICD-10-CM | POA: Diagnosis not present

## 2020-11-06 DIAGNOSIS — R262 Difficulty in walking, not elsewhere classified: Secondary | ICD-10-CM | POA: Diagnosis not present

## 2020-11-06 DIAGNOSIS — M25661 Stiffness of right knee, not elsewhere classified: Secondary | ICD-10-CM | POA: Diagnosis not present

## 2020-11-06 DIAGNOSIS — M6281 Muscle weakness (generalized): Secondary | ICD-10-CM | POA: Diagnosis not present

## 2020-11-08 ENCOUNTER — Ambulatory Visit: Payer: Medicare Other | Attending: Internal Medicine

## 2020-11-08 DIAGNOSIS — R262 Difficulty in walking, not elsewhere classified: Secondary | ICD-10-CM | POA: Diagnosis not present

## 2020-11-08 DIAGNOSIS — M25661 Stiffness of right knee, not elsewhere classified: Secondary | ICD-10-CM | POA: Diagnosis not present

## 2020-11-08 DIAGNOSIS — M25662 Stiffness of left knee, not elsewhere classified: Secondary | ICD-10-CM | POA: Diagnosis not present

## 2020-11-08 DIAGNOSIS — Z23 Encounter for immunization: Secondary | ICD-10-CM

## 2020-11-08 DIAGNOSIS — M6281 Muscle weakness (generalized): Secondary | ICD-10-CM | POA: Diagnosis not present

## 2020-11-08 NOTE — Progress Notes (Signed)
   Covid-19 Vaccination Clinic  Name:  Melinda Sanford    MRN: [id number] DOB: 1946/03/12  11/08/2020  [name] was observed post Covid-19 immunization for 15 minutes without incident. She was provided with Vaccine Information Sheet and instruction to access the V-Safe system.   [name] was instructed to call 911 with any severe reactions post vaccine: Marland Kitchen Difficulty breathing  . Swelling of face and throat  . A fast heartbeat  . A bad rash all over body  . Dizziness and weakness   Immunizations Administered    Name Date Dose VIS Date Route   PFIZER Comrnaty(Gray TOP) Covid-19 Vaccine 11/08/2020 11:00 AM 0.3 mL 05/25/2020 Intramuscular   Manufacturer: [employer], [employer]   Lot: [id number]   NDC: [id number]

## 2020-11-09 ENCOUNTER — Other Ambulatory Visit (HOSPITAL_COMMUNITY): Payer: Self-pay

## 2020-11-09 DIAGNOSIS — G4733 Obstructive sleep apnea (adult) (pediatric): Secondary | ICD-10-CM | POA: Diagnosis not present

## 2020-11-09 MED ORDER — COVID-19 MRNA VAC-TRIS(PFIZER) 30 MCG/0.3ML IM SUSP
INTRAMUSCULAR | 0 refills | Status: DC
  Filled 2020-11-09: qty 0.3, 1d supply, fill #0

## 2020-11-10 ENCOUNTER — Other Ambulatory Visit (HOSPITAL_COMMUNITY): Payer: Self-pay

## 2020-11-16 DIAGNOSIS — M542 Cervicalgia: Secondary | ICD-10-CM | POA: Diagnosis not present

## 2020-11-16 DIAGNOSIS — G43719 Chronic migraine without aura, intractable, without status migrainosus: Secondary | ICD-10-CM | POA: Diagnosis not present

## 2020-11-16 DIAGNOSIS — M791 Myalgia, unspecified site: Secondary | ICD-10-CM | POA: Diagnosis not present

## 2020-11-22 ENCOUNTER — Telehealth: Payer: Self-pay

## 2020-11-22 DIAGNOSIS — M546 Pain in thoracic spine: Secondary | ICD-10-CM | POA: Diagnosis not present

## 2020-11-22 DIAGNOSIS — S233XXD Sprain of ligaments of thoracic spine, subsequent encounter: Secondary | ICD-10-CM | POA: Diagnosis not present

## 2020-11-22 DIAGNOSIS — Z006 Encounter for examination for normal comparison and control in clinical research program: Secondary | ICD-10-CM

## 2020-11-22 DIAGNOSIS — M6281 Muscle weakness (generalized): Secondary | ICD-10-CM | POA: Diagnosis not present

## 2020-11-22 NOTE — Telephone Encounter (Signed)
I have attempted without success to contact this patient by phone for her Identify 90 day follow up phone call. I left a message for patient to return my phone call with my name and callback number. An e-mail was also sent to patient.  

## 2020-11-29 DIAGNOSIS — M170 Bilateral primary osteoarthritis of knee: Secondary | ICD-10-CM | POA: Diagnosis not present

## 2020-11-30 DIAGNOSIS — J019 Acute sinusitis, unspecified: Secondary | ICD-10-CM | POA: Diagnosis not present

## 2020-12-04 DIAGNOSIS — I10 Essential (primary) hypertension: Secondary | ICD-10-CM | POA: Diagnosis not present

## 2020-12-04 DIAGNOSIS — J019 Acute sinusitis, unspecified: Secondary | ICD-10-CM | POA: Diagnosis not present

## 2020-12-10 DIAGNOSIS — G4733 Obstructive sleep apnea (adult) (pediatric): Secondary | ICD-10-CM | POA: Diagnosis not present

## 2020-12-12 DIAGNOSIS — M170 Bilateral primary osteoarthritis of knee: Secondary | ICD-10-CM | POA: Diagnosis not present

## 2020-12-12 DIAGNOSIS — J4520 Mild intermittent asthma, uncomplicated: Secondary | ICD-10-CM | POA: Diagnosis not present

## 2020-12-12 DIAGNOSIS — D509 Iron deficiency anemia, unspecified: Secondary | ICD-10-CM | POA: Diagnosis not present

## 2020-12-12 DIAGNOSIS — E119 Type 2 diabetes mellitus without complications: Secondary | ICD-10-CM | POA: Diagnosis not present

## 2020-12-12 DIAGNOSIS — G43009 Migraine without aura, not intractable, without status migrainosus: Secondary | ICD-10-CM | POA: Diagnosis not present

## 2020-12-12 DIAGNOSIS — K219 Gastro-esophageal reflux disease without esophagitis: Secondary | ICD-10-CM | POA: Diagnosis not present

## 2020-12-12 DIAGNOSIS — J4540 Moderate persistent asthma, uncomplicated: Secondary | ICD-10-CM | POA: Diagnosis not present

## 2020-12-12 DIAGNOSIS — E039 Hypothyroidism, unspecified: Secondary | ICD-10-CM | POA: Diagnosis not present

## 2020-12-12 DIAGNOSIS — E1159 Type 2 diabetes mellitus with other circulatory complications: Secondary | ICD-10-CM | POA: Diagnosis not present

## 2020-12-12 DIAGNOSIS — I10 Essential (primary) hypertension: Secondary | ICD-10-CM | POA: Diagnosis not present

## 2020-12-12 DIAGNOSIS — E1140 Type 2 diabetes mellitus with diabetic neuropathy, unspecified: Secondary | ICD-10-CM | POA: Diagnosis not present

## 2020-12-26 DIAGNOSIS — D509 Iron deficiency anemia, unspecified: Secondary | ICD-10-CM | POA: Diagnosis not present

## 2020-12-26 DIAGNOSIS — R3589 Other polyuria: Secondary | ICD-10-CM | POA: Diagnosis not present

## 2020-12-26 DIAGNOSIS — E1140 Type 2 diabetes mellitus with diabetic neuropathy, unspecified: Secondary | ICD-10-CM | POA: Diagnosis not present

## 2020-12-26 DIAGNOSIS — J4520 Mild intermittent asthma, uncomplicated: Secondary | ICD-10-CM | POA: Diagnosis not present

## 2020-12-26 DIAGNOSIS — H538 Other visual disturbances: Secondary | ICD-10-CM | POA: Diagnosis not present

## 2020-12-26 DIAGNOSIS — I10 Essential (primary) hypertension: Secondary | ICD-10-CM | POA: Diagnosis not present

## 2020-12-28 DIAGNOSIS — E119 Type 2 diabetes mellitus without complications: Secondary | ICD-10-CM | POA: Diagnosis not present

## 2020-12-28 DIAGNOSIS — I10 Essential (primary) hypertension: Secondary | ICD-10-CM | POA: Diagnosis not present

## 2020-12-28 DIAGNOSIS — E039 Hypothyroidism, unspecified: Secondary | ICD-10-CM | POA: Diagnosis not present

## 2020-12-28 DIAGNOSIS — G43009 Migraine without aura, not intractable, without status migrainosus: Secondary | ICD-10-CM | POA: Diagnosis not present

## 2020-12-28 DIAGNOSIS — E1140 Type 2 diabetes mellitus with diabetic neuropathy, unspecified: Secondary | ICD-10-CM | POA: Diagnosis not present

## 2020-12-28 DIAGNOSIS — K219 Gastro-esophageal reflux disease without esophagitis: Secondary | ICD-10-CM | POA: Diagnosis not present

## 2020-12-28 DIAGNOSIS — J4540 Moderate persistent asthma, uncomplicated: Secondary | ICD-10-CM | POA: Diagnosis not present

## 2020-12-28 DIAGNOSIS — E1159 Type 2 diabetes mellitus with other circulatory complications: Secondary | ICD-10-CM | POA: Diagnosis not present

## 2020-12-28 DIAGNOSIS — J4520 Mild intermittent asthma, uncomplicated: Secondary | ICD-10-CM | POA: Diagnosis not present

## 2021-01-02 DIAGNOSIS — E1140 Type 2 diabetes mellitus with diabetic neuropathy, unspecified: Secondary | ICD-10-CM | POA: Diagnosis not present

## 2021-01-02 DIAGNOSIS — D509 Iron deficiency anemia, unspecified: Secondary | ICD-10-CM | POA: Diagnosis not present

## 2021-01-02 DIAGNOSIS — I10 Essential (primary) hypertension: Secondary | ICD-10-CM | POA: Diagnosis not present

## 2021-01-02 DIAGNOSIS — H538 Other visual disturbances: Secondary | ICD-10-CM | POA: Diagnosis not present

## 2021-01-03 DIAGNOSIS — G43719 Chronic migraine without aura, intractable, without status migrainosus: Secondary | ICD-10-CM | POA: Diagnosis not present

## 2021-01-03 DIAGNOSIS — M791 Myalgia, unspecified site: Secondary | ICD-10-CM | POA: Diagnosis not present

## 2021-01-03 DIAGNOSIS — Z79891 Long term (current) use of opiate analgesic: Secondary | ICD-10-CM | POA: Diagnosis not present

## 2021-01-03 DIAGNOSIS — M47816 Spondylosis without myelopathy or radiculopathy, lumbar region: Secondary | ICD-10-CM | POA: Diagnosis not present

## 2021-01-03 DIAGNOSIS — M542 Cervicalgia: Secondary | ICD-10-CM | POA: Diagnosis not present

## 2021-01-03 DIAGNOSIS — M961 Postlaminectomy syndrome, not elsewhere classified: Secondary | ICD-10-CM | POA: Diagnosis not present

## 2021-01-03 DIAGNOSIS — G894 Chronic pain syndrome: Secondary | ICD-10-CM | POA: Diagnosis not present

## 2021-01-08 DIAGNOSIS — I10 Essential (primary) hypertension: Secondary | ICD-10-CM | POA: Diagnosis not present

## 2021-01-09 DIAGNOSIS — G4733 Obstructive sleep apnea (adult) (pediatric): Secondary | ICD-10-CM | POA: Diagnosis not present

## 2021-01-18 DIAGNOSIS — M542 Cervicalgia: Secondary | ICD-10-CM | POA: Diagnosis not present

## 2021-01-18 DIAGNOSIS — G43719 Chronic migraine without aura, intractable, without status migrainosus: Secondary | ICD-10-CM | POA: Diagnosis not present

## 2021-01-18 DIAGNOSIS — M791 Myalgia, unspecified site: Secondary | ICD-10-CM | POA: Diagnosis not present

## 2021-01-23 DIAGNOSIS — G4733 Obstructive sleep apnea (adult) (pediatric): Secondary | ICD-10-CM | POA: Diagnosis not present

## 2021-02-01 DIAGNOSIS — E119 Type 2 diabetes mellitus without complications: Secondary | ICD-10-CM | POA: Diagnosis not present

## 2021-02-01 DIAGNOSIS — G43719 Chronic migraine without aura, intractable, without status migrainosus: Secondary | ICD-10-CM | POA: Diagnosis not present

## 2021-02-01 DIAGNOSIS — M791 Myalgia, unspecified site: Secondary | ICD-10-CM | POA: Diagnosis not present

## 2021-02-01 DIAGNOSIS — J4540 Moderate persistent asthma, uncomplicated: Secondary | ICD-10-CM | POA: Diagnosis not present

## 2021-02-01 DIAGNOSIS — J4520 Mild intermittent asthma, uncomplicated: Secondary | ICD-10-CM | POA: Diagnosis not present

## 2021-02-01 DIAGNOSIS — M542 Cervicalgia: Secondary | ICD-10-CM | POA: Diagnosis not present

## 2021-02-01 DIAGNOSIS — E1159 Type 2 diabetes mellitus with other circulatory complications: Secondary | ICD-10-CM | POA: Diagnosis not present

## 2021-02-01 DIAGNOSIS — E039 Hypothyroidism, unspecified: Secondary | ICD-10-CM | POA: Diagnosis not present

## 2021-02-01 DIAGNOSIS — G43009 Migraine without aura, not intractable, without status migrainosus: Secondary | ICD-10-CM | POA: Diagnosis not present

## 2021-02-01 DIAGNOSIS — I10 Essential (primary) hypertension: Secondary | ICD-10-CM | POA: Diagnosis not present

## 2021-02-01 DIAGNOSIS — E1140 Type 2 diabetes mellitus with diabetic neuropathy, unspecified: Secondary | ICD-10-CM | POA: Diagnosis not present

## 2021-02-01 DIAGNOSIS — K219 Gastro-esophageal reflux disease without esophagitis: Secondary | ICD-10-CM | POA: Diagnosis not present

## 2021-02-08 DIAGNOSIS — I10 Essential (primary) hypertension: Secondary | ICD-10-CM | POA: Diagnosis not present

## 2021-02-20 DIAGNOSIS — G43719 Chronic migraine without aura, intractable, without status migrainosus: Secondary | ICD-10-CM | POA: Diagnosis not present

## 2021-02-20 DIAGNOSIS — M542 Cervicalgia: Secondary | ICD-10-CM | POA: Diagnosis not present

## 2021-02-20 DIAGNOSIS — M791 Myalgia, unspecified site: Secondary | ICD-10-CM | POA: Diagnosis not present

## 2021-03-07 DIAGNOSIS — M170 Bilateral primary osteoarthritis of knee: Secondary | ICD-10-CM | POA: Diagnosis not present

## 2021-03-09 DIAGNOSIS — M170 Bilateral primary osteoarthritis of knee: Secondary | ICD-10-CM | POA: Diagnosis not present

## 2021-03-09 DIAGNOSIS — G43009 Migraine without aura, not intractable, without status migrainosus: Secondary | ICD-10-CM | POA: Diagnosis not present

## 2021-03-09 DIAGNOSIS — E039 Hypothyroidism, unspecified: Secondary | ICD-10-CM | POA: Diagnosis not present

## 2021-03-09 DIAGNOSIS — E119 Type 2 diabetes mellitus without complications: Secondary | ICD-10-CM | POA: Diagnosis not present

## 2021-03-09 DIAGNOSIS — E1140 Type 2 diabetes mellitus with diabetic neuropathy, unspecified: Secondary | ICD-10-CM | POA: Diagnosis not present

## 2021-03-09 DIAGNOSIS — I10 Essential (primary) hypertension: Secondary | ICD-10-CM | POA: Diagnosis not present

## 2021-03-09 DIAGNOSIS — K219 Gastro-esophageal reflux disease without esophagitis: Secondary | ICD-10-CM | POA: Diagnosis not present

## 2021-03-09 DIAGNOSIS — E1159 Type 2 diabetes mellitus with other circulatory complications: Secondary | ICD-10-CM | POA: Diagnosis not present

## 2021-03-09 DIAGNOSIS — J4540 Moderate persistent asthma, uncomplicated: Secondary | ICD-10-CM | POA: Diagnosis not present

## 2021-03-09 DIAGNOSIS — J4520 Mild intermittent asthma, uncomplicated: Secondary | ICD-10-CM | POA: Diagnosis not present

## 2021-03-12 DIAGNOSIS — M791 Myalgia, unspecified site: Secondary | ICD-10-CM | POA: Diagnosis not present

## 2021-03-12 DIAGNOSIS — M542 Cervicalgia: Secondary | ICD-10-CM | POA: Diagnosis not present

## 2021-03-12 DIAGNOSIS — G43719 Chronic migraine without aura, intractable, without status migrainosus: Secondary | ICD-10-CM | POA: Diagnosis not present

## 2021-03-13 DIAGNOSIS — I10 Essential (primary) hypertension: Secondary | ICD-10-CM | POA: Diagnosis not present

## 2021-03-27 ENCOUNTER — Ambulatory Visit
Admission: RE | Admit: 2021-03-27 | Discharge: 2021-03-27 | Disposition: A | Discharge status: Home / Self Care | Payer: Medicare Other | Source: Ambulatory Visit | Attending: Internal Medicine | Admitting: Internal Medicine

## 2021-03-27 DIAGNOSIS — Z780 Asymptomatic menopausal state: Secondary | ICD-10-CM | POA: Diagnosis not present

## 2021-03-27 DIAGNOSIS — Z13820 Encounter for screening for osteoporosis: Secondary | ICD-10-CM

## 2021-04-03 DIAGNOSIS — I10 Essential (primary) hypertension: Secondary | ICD-10-CM | POA: Diagnosis not present

## 2021-04-03 DIAGNOSIS — R252 Cramp and spasm: Secondary | ICD-10-CM | POA: Diagnosis not present

## 2021-04-04 DIAGNOSIS — Z79891 Long term (current) use of opiate analgesic: Secondary | ICD-10-CM | POA: Diagnosis not present

## 2021-04-04 DIAGNOSIS — M961 Postlaminectomy syndrome, not elsewhere classified: Secondary | ICD-10-CM | POA: Diagnosis not present

## 2021-04-04 DIAGNOSIS — M47816 Spondylosis without myelopathy or radiculopathy, lumbar region: Secondary | ICD-10-CM | POA: Diagnosis not present

## 2021-04-04 DIAGNOSIS — G894 Chronic pain syndrome: Secondary | ICD-10-CM | POA: Diagnosis not present

## 2021-04-10 DIAGNOSIS — G43719 Chronic migraine without aura, intractable, without status migrainosus: Secondary | ICD-10-CM | POA: Diagnosis not present

## 2021-04-10 DIAGNOSIS — M542 Cervicalgia: Secondary | ICD-10-CM | POA: Diagnosis not present

## 2021-04-10 DIAGNOSIS — M791 Myalgia, unspecified site: Secondary | ICD-10-CM | POA: Diagnosis not present

## 2021-04-13 DIAGNOSIS — E1159 Type 2 diabetes mellitus with other circulatory complications: Secondary | ICD-10-CM | POA: Diagnosis not present

## 2021-04-13 DIAGNOSIS — I10 Essential (primary) hypertension: Secondary | ICD-10-CM | POA: Diagnosis not present

## 2021-04-13 DIAGNOSIS — M170 Bilateral primary osteoarthritis of knee: Secondary | ICD-10-CM | POA: Diagnosis not present

## 2021-04-13 DIAGNOSIS — E039 Hypothyroidism, unspecified: Secondary | ICD-10-CM | POA: Diagnosis not present

## 2021-04-13 DIAGNOSIS — K219 Gastro-esophageal reflux disease without esophagitis: Secondary | ICD-10-CM | POA: Diagnosis not present

## 2021-04-13 DIAGNOSIS — J4540 Moderate persistent asthma, uncomplicated: Secondary | ICD-10-CM | POA: Diagnosis not present

## 2021-04-13 DIAGNOSIS — E1140 Type 2 diabetes mellitus with diabetic neuropathy, unspecified: Secondary | ICD-10-CM | POA: Diagnosis not present

## 2021-04-13 DIAGNOSIS — G43009 Migraine without aura, not intractable, without status migrainosus: Secondary | ICD-10-CM | POA: Diagnosis not present

## 2021-04-13 DIAGNOSIS — J4520 Mild intermittent asthma, uncomplicated: Secondary | ICD-10-CM | POA: Diagnosis not present

## 2021-04-20 DIAGNOSIS — G43009 Migraine without aura, not intractable, without status migrainosus: Secondary | ICD-10-CM | POA: Diagnosis not present

## 2021-04-20 DIAGNOSIS — E039 Hypothyroidism, unspecified: Secondary | ICD-10-CM | POA: Diagnosis not present

## 2021-04-20 DIAGNOSIS — E1159 Type 2 diabetes mellitus with other circulatory complications: Secondary | ICD-10-CM | POA: Diagnosis not present

## 2021-04-20 DIAGNOSIS — M170 Bilateral primary osteoarthritis of knee: Secondary | ICD-10-CM | POA: Diagnosis not present

## 2021-04-20 DIAGNOSIS — K219 Gastro-esophageal reflux disease without esophagitis: Secondary | ICD-10-CM | POA: Diagnosis not present

## 2021-04-20 DIAGNOSIS — I10 Essential (primary) hypertension: Secondary | ICD-10-CM | POA: Diagnosis not present

## 2021-04-20 DIAGNOSIS — J4540 Moderate persistent asthma, uncomplicated: Secondary | ICD-10-CM | POA: Diagnosis not present

## 2021-04-20 DIAGNOSIS — E1140 Type 2 diabetes mellitus with diabetic neuropathy, unspecified: Secondary | ICD-10-CM | POA: Diagnosis not present

## 2021-04-20 DIAGNOSIS — J4520 Mild intermittent asthma, uncomplicated: Secondary | ICD-10-CM | POA: Diagnosis not present

## 2021-04-20 DIAGNOSIS — E119 Type 2 diabetes mellitus without complications: Secondary | ICD-10-CM | POA: Diagnosis not present

## 2021-04-23 DIAGNOSIS — G4733 Obstructive sleep apnea (adult) (pediatric): Secondary | ICD-10-CM | POA: Diagnosis not present

## 2021-05-09 DIAGNOSIS — M791 Myalgia, unspecified site: Secondary | ICD-10-CM | POA: Diagnosis not present

## 2021-05-09 DIAGNOSIS — M542 Cervicalgia: Secondary | ICD-10-CM | POA: Diagnosis not present

## 2021-05-09 DIAGNOSIS — G43719 Chronic migraine without aura, intractable, without status migrainosus: Secondary | ICD-10-CM | POA: Diagnosis not present

## 2021-05-21 DIAGNOSIS — G43009 Migraine without aura, not intractable, without status migrainosus: Secondary | ICD-10-CM | POA: Diagnosis not present

## 2021-05-21 DIAGNOSIS — E1140 Type 2 diabetes mellitus with diabetic neuropathy, unspecified: Secondary | ICD-10-CM | POA: Diagnosis not present

## 2021-05-21 DIAGNOSIS — J4520 Mild intermittent asthma, uncomplicated: Secondary | ICD-10-CM | POA: Diagnosis not present

## 2021-05-21 DIAGNOSIS — D509 Iron deficiency anemia, unspecified: Secondary | ICD-10-CM | POA: Diagnosis not present

## 2021-05-21 DIAGNOSIS — E1159 Type 2 diabetes mellitus with other circulatory complications: Secondary | ICD-10-CM | POA: Diagnosis not present

## 2021-05-21 DIAGNOSIS — I10 Essential (primary) hypertension: Secondary | ICD-10-CM | POA: Diagnosis not present

## 2021-05-21 DIAGNOSIS — E039 Hypothyroidism, unspecified: Secondary | ICD-10-CM | POA: Diagnosis not present

## 2021-05-22 DIAGNOSIS — N6315 Unspecified lump in the right breast, overlapping quadrants: Secondary | ICD-10-CM | POA: Diagnosis not present

## 2021-05-23 ENCOUNTER — Other Ambulatory Visit: Payer: Self-pay | Admitting: Obstetrics and Gynecology

## 2021-05-23 DIAGNOSIS — N63 Unspecified lump in unspecified breast: Secondary | ICD-10-CM

## 2021-06-06 DIAGNOSIS — G518 Other disorders of facial nerve: Secondary | ICD-10-CM | POA: Diagnosis not present

## 2021-06-06 DIAGNOSIS — G43719 Chronic migraine without aura, intractable, without status migrainosus: Secondary | ICD-10-CM | POA: Diagnosis not present

## 2021-06-06 DIAGNOSIS — M542 Cervicalgia: Secondary | ICD-10-CM | POA: Diagnosis not present

## 2021-06-06 DIAGNOSIS — M791 Myalgia, unspecified site: Secondary | ICD-10-CM | POA: Diagnosis not present

## 2021-06-07 ENCOUNTER — Other Ambulatory Visit: Payer: Self-pay

## 2021-06-07 ENCOUNTER — Ambulatory Visit
Admission: RE | Admit: 2021-06-07 | Discharge: 2021-06-07 | Disposition: A | Discharge status: Home / Self Care | Payer: Medicare Other | Source: Ambulatory Visit | Attending: Obstetrics and Gynecology | Admitting: Obstetrics and Gynecology

## 2021-06-07 ENCOUNTER — Other Ambulatory Visit: Payer: Self-pay | Admitting: Obstetrics and Gynecology

## 2021-06-07 DIAGNOSIS — N63 Unspecified lump in unspecified breast: Secondary | ICD-10-CM

## 2021-06-07 DIAGNOSIS — R922 Inconclusive mammogram: Secondary | ICD-10-CM | POA: Diagnosis not present

## 2021-06-26 ENCOUNTER — Other Ambulatory Visit: Payer: Self-pay | Admitting: Obstetrics and Gynecology

## 2021-06-26 DIAGNOSIS — N6001 Solitary cyst of right breast: Secondary | ICD-10-CM

## 2021-07-12 ENCOUNTER — Ambulatory Visit
Admission: RE | Admit: 2021-07-12 | Discharge: 2021-07-12 | Disposition: A | Discharge status: Home / Self Care | Payer: Medicare PPO | Source: Ambulatory Visit | Attending: Obstetrics and Gynecology | Admitting: Obstetrics and Gynecology

## 2021-07-12 DIAGNOSIS — N6001 Solitary cyst of right breast: Secondary | ICD-10-CM

## 2021-07-19 DIAGNOSIS — G518 Other disorders of facial nerve: Secondary | ICD-10-CM | POA: Diagnosis not present

## 2021-07-19 DIAGNOSIS — M791 Myalgia, unspecified site: Secondary | ICD-10-CM | POA: Diagnosis not present

## 2021-07-19 DIAGNOSIS — G43719 Chronic migraine without aura, intractable, without status migrainosus: Secondary | ICD-10-CM | POA: Diagnosis not present

## 2021-07-19 DIAGNOSIS — M542 Cervicalgia: Secondary | ICD-10-CM | POA: Diagnosis not present

## 2021-07-23 DIAGNOSIS — G4733 Obstructive sleep apnea (adult) (pediatric): Secondary | ICD-10-CM | POA: Diagnosis not present

## 2021-07-24 DIAGNOSIS — I10 Essential (primary) hypertension: Secondary | ICD-10-CM | POA: Diagnosis not present

## 2021-07-24 DIAGNOSIS — J4520 Mild intermittent asthma, uncomplicated: Secondary | ICD-10-CM | POA: Diagnosis not present

## 2021-08-01 DIAGNOSIS — R059 Cough, unspecified: Secondary | ICD-10-CM | POA: Diagnosis not present

## 2021-08-01 DIAGNOSIS — M47816 Spondylosis without myelopathy or radiculopathy, lumbar region: Secondary | ICD-10-CM | POA: Diagnosis not present

## 2021-08-01 DIAGNOSIS — M961 Postlaminectomy syndrome, not elsewhere classified: Secondary | ICD-10-CM | POA: Diagnosis not present

## 2021-08-01 DIAGNOSIS — G894 Chronic pain syndrome: Secondary | ICD-10-CM | POA: Diagnosis not present

## 2021-08-01 DIAGNOSIS — Z79891 Long term (current) use of opiate analgesic: Secondary | ICD-10-CM | POA: Diagnosis not present

## 2021-08-01 DIAGNOSIS — J329 Chronic sinusitis, unspecified: Secondary | ICD-10-CM | POA: Diagnosis not present

## 2021-08-02 DIAGNOSIS — G43719 Chronic migraine without aura, intractable, without status migrainosus: Secondary | ICD-10-CM | POA: Diagnosis not present

## 2021-08-02 DIAGNOSIS — G518 Other disorders of facial nerve: Secondary | ICD-10-CM | POA: Diagnosis not present

## 2021-08-02 DIAGNOSIS — M791 Myalgia, unspecified site: Secondary | ICD-10-CM | POA: Diagnosis not present

## 2021-08-02 DIAGNOSIS — M542 Cervicalgia: Secondary | ICD-10-CM | POA: Diagnosis not present

## 2021-08-03 DIAGNOSIS — M170 Bilateral primary osteoarthritis of knee: Secondary | ICD-10-CM | POA: Diagnosis not present

## 2021-08-16 DIAGNOSIS — S80211A Abrasion, right knee, initial encounter: Secondary | ICD-10-CM | POA: Diagnosis not present

## 2021-08-16 DIAGNOSIS — M25532 Pain in left wrist: Secondary | ICD-10-CM | POA: Diagnosis not present

## 2021-08-16 DIAGNOSIS — W010XXA Fall on same level from slipping, tripping and stumbling without subsequent striking against object, initial encounter: Secondary | ICD-10-CM | POA: Diagnosis not present

## 2021-08-16 DIAGNOSIS — S8991XA Unspecified injury of right lower leg, initial encounter: Secondary | ICD-10-CM | POA: Diagnosis not present

## 2021-08-16 DIAGNOSIS — S6992XA Unspecified injury of left wrist, hand and finger(s), initial encounter: Secondary | ICD-10-CM | POA: Diagnosis not present

## 2021-08-17 ENCOUNTER — Encounter: Payer: Self-pay | Admitting: Podiatry

## 2021-08-17 ENCOUNTER — Other Ambulatory Visit: Payer: Self-pay

## 2021-08-17 ENCOUNTER — Ambulatory Visit (INDEPENDENT_AMBULATORY_CARE_PROVIDER_SITE_OTHER): Payer: Medicare Other

## 2021-08-17 ENCOUNTER — Ambulatory Visit: Payer: Medicare Other | Admitting: Podiatry

## 2021-08-17 DIAGNOSIS — L600 Ingrowing nail: Secondary | ICD-10-CM

## 2021-08-17 DIAGNOSIS — M79675 Pain in left toe(s): Secondary | ICD-10-CM

## 2021-08-17 NOTE — Patient Instructions (Signed)

## 2021-08-19 NOTE — Progress Notes (Signed)
Subjective:  ? ?Patient ID: Melinda Sanford, female   DOB: 76 y.o.   MRN: [id number]  ? ?HPI ?Patient presents stating her left nailbed has become very bruised and it sore and she traumatized it by dropping something on it.  States that it is painful and hard to wear shoe gear with.  Patient does not smoke likes to be active if possible ? ? ?Review of Systems  ?All other systems reviewed and are negative. ? ? ?   ?Objective:  ?Physical Exam ?Vitals and nursing note reviewed.  ?Constitutional:   ?   Appearance: She is well-developed.  ?Pulmonary:  ?   Effort: Pulmonary effort is normal.  ?Musculoskeletal:     ?   General: Normal range of motion.  ?Skin: ?   General: Skin is warm.  ?Neurological:  ?   Mental Status: She is alert.  ?  ?Neurovascular status intact muscle strength found to be adequate range of motion was adequate.  The left hallux nail bed is very thickened it is dystrophic and it is painful when pressed with discoloration and looseness but no drainage or other pathology ? ?   ?Assessment:  ?Damaged left hallux nailbed secondary to trauma that is partially detached and painful ? ?   ?Plan:  ?H&P reviewed condition and discussed treatments.  At this point I recommended removing the nail allowing new nail to regrow but no guarantee it will grow back normally.  I went ahead today and I did anesthetized the left hallux 60 mg like a Marcaine mixture sterile prep remove the nail flushed out the bed did not see any drainage or other pathology applied sterile dressing and we will see this patient back ? ?X-rays were negative for signs of fracture or bony pathology associated with injury ?   ? ? ?

## 2021-08-24 DIAGNOSIS — M545 Low back pain, unspecified: Secondary | ICD-10-CM | POA: Diagnosis not present

## 2021-08-24 DIAGNOSIS — M542 Cervicalgia: Secondary | ICD-10-CM | POA: Diagnosis not present

## 2021-08-24 DIAGNOSIS — M25532 Pain in left wrist: Secondary | ICD-10-CM | POA: Diagnosis not present

## 2021-08-28 ENCOUNTER — Other Ambulatory Visit: Payer: Self-pay | Admitting: Podiatry

## 2021-08-28 DIAGNOSIS — L600 Ingrowing nail: Secondary | ICD-10-CM

## 2021-08-31 ENCOUNTER — Other Ambulatory Visit: Payer: Self-pay | Admitting: Internal Medicine

## 2021-08-31 DIAGNOSIS — E1140 Type 2 diabetes mellitus with diabetic neuropathy, unspecified: Secondary | ICD-10-CM | POA: Diagnosis not present

## 2021-08-31 DIAGNOSIS — S0990XD Unspecified injury of head, subsequent encounter: Secondary | ICD-10-CM | POA: Diagnosis not present

## 2021-08-31 DIAGNOSIS — K219 Gastro-esophageal reflux disease without esophagitis: Secondary | ICD-10-CM | POA: Diagnosis not present

## 2021-08-31 DIAGNOSIS — I10 Essential (primary) hypertension: Secondary | ICD-10-CM | POA: Diagnosis not present

## 2021-08-31 DIAGNOSIS — R1013 Epigastric pain: Secondary | ICD-10-CM | POA: Diagnosis not present

## 2021-08-31 DIAGNOSIS — R110 Nausea: Secondary | ICD-10-CM | POA: Diagnosis not present

## 2021-09-16 DIAGNOSIS — J0140 Acute pansinusitis, unspecified: Secondary | ICD-10-CM | POA: Diagnosis not present

## 2021-09-16 DIAGNOSIS — J302 Other seasonal allergic rhinitis: Secondary | ICD-10-CM | POA: Diagnosis not present

## 2021-09-24 ENCOUNTER — Ambulatory Visit
Admission: RE | Admit: 2021-09-24 | Discharge: 2021-09-24 | Disposition: A | Discharge status: Home / Self Care | Payer: BC Managed Care – PPO | Source: Ambulatory Visit | Attending: Internal Medicine | Admitting: Internal Medicine

## 2021-09-24 DIAGNOSIS — R519 Headache, unspecified: Secondary | ICD-10-CM | POA: Diagnosis not present

## 2021-09-24 DIAGNOSIS — R413 Other amnesia: Secondary | ICD-10-CM | POA: Diagnosis not present

## 2021-09-24 DIAGNOSIS — S0990XD Unspecified injury of head, subsequent encounter: Secondary | ICD-10-CM

## 2021-09-24 DIAGNOSIS — S0990XA Unspecified injury of head, initial encounter: Secondary | ICD-10-CM | POA: Diagnosis not present

## 2021-09-24 DIAGNOSIS — R55 Syncope and collapse: Secondary | ICD-10-CM | POA: Diagnosis not present

## 2021-09-28 DIAGNOSIS — G43719 Chronic migraine without aura, intractable, without status migrainosus: Secondary | ICD-10-CM | POA: Diagnosis not present

## 2021-09-28 DIAGNOSIS — G518 Other disorders of facial nerve: Secondary | ICD-10-CM | POA: Diagnosis not present

## 2021-09-28 DIAGNOSIS — M542 Cervicalgia: Secondary | ICD-10-CM | POA: Diagnosis not present

## 2021-09-28 DIAGNOSIS — M791 Myalgia, unspecified site: Secondary | ICD-10-CM | POA: Diagnosis not present

## 2021-10-04 DIAGNOSIS — Z79891 Long term (current) use of opiate analgesic: Secondary | ICD-10-CM | POA: Diagnosis not present

## 2021-10-04 DIAGNOSIS — M961 Postlaminectomy syndrome, not elsewhere classified: Secondary | ICD-10-CM | POA: Diagnosis not present

## 2021-10-04 DIAGNOSIS — M47816 Spondylosis without myelopathy or radiculopathy, lumbar region: Secondary | ICD-10-CM | POA: Diagnosis not present

## 2021-10-04 DIAGNOSIS — G894 Chronic pain syndrome: Secondary | ICD-10-CM | POA: Diagnosis not present

## 2021-10-05 DIAGNOSIS — J4520 Mild intermittent asthma, uncomplicated: Secondary | ICD-10-CM | POA: Diagnosis not present

## 2021-10-05 DIAGNOSIS — G43009 Migraine without aura, not intractable, without status migrainosus: Secondary | ICD-10-CM | POA: Diagnosis not present

## 2021-10-05 DIAGNOSIS — M25532 Pain in left wrist: Secondary | ICD-10-CM | POA: Diagnosis not present

## 2021-10-05 DIAGNOSIS — I10 Essential (primary) hypertension: Secondary | ICD-10-CM | POA: Diagnosis not present

## 2021-10-05 DIAGNOSIS — K219 Gastro-esophageal reflux disease without esophagitis: Secondary | ICD-10-CM | POA: Diagnosis not present

## 2021-10-05 DIAGNOSIS — M170 Bilateral primary osteoarthritis of knee: Secondary | ICD-10-CM | POA: Diagnosis not present

## 2021-10-05 DIAGNOSIS — E039 Hypothyroidism, unspecified: Secondary | ICD-10-CM | POA: Diagnosis not present

## 2021-10-05 DIAGNOSIS — E1140 Type 2 diabetes mellitus with diabetic neuropathy, unspecified: Secondary | ICD-10-CM | POA: Diagnosis not present

## 2021-10-11 DIAGNOSIS — N3281 Overactive bladder: Secondary | ICD-10-CM | POA: Diagnosis not present

## 2021-10-18 DIAGNOSIS — N3281 Overactive bladder: Secondary | ICD-10-CM | POA: Diagnosis not present

## 2021-10-22 DIAGNOSIS — G4733 Obstructive sleep apnea (adult) (pediatric): Secondary | ICD-10-CM | POA: Diagnosis not present

## 2021-10-23 DIAGNOSIS — J069 Acute upper respiratory infection, unspecified: Secondary | ICD-10-CM | POA: Diagnosis not present

## 2021-10-23 DIAGNOSIS — J321 Chronic frontal sinusitis: Secondary | ICD-10-CM | POA: Diagnosis not present

## 2021-10-23 DIAGNOSIS — J329 Chronic sinusitis, unspecified: Secondary | ICD-10-CM | POA: Diagnosis not present

## 2021-11-02 DIAGNOSIS — M25512 Pain in left shoulder: Secondary | ICD-10-CM | POA: Diagnosis not present

## 2021-11-02 DIAGNOSIS — E119 Type 2 diabetes mellitus without complications: Secondary | ICD-10-CM | POA: Diagnosis not present

## 2021-11-02 DIAGNOSIS — R42 Dizziness and giddiness: Secondary | ICD-10-CM | POA: Diagnosis not present

## 2021-11-02 DIAGNOSIS — E039 Hypothyroidism, unspecified: Secondary | ICD-10-CM | POA: Diagnosis not present

## 2021-11-02 DIAGNOSIS — M25532 Pain in left wrist: Secondary | ICD-10-CM | POA: Diagnosis not present

## 2021-11-02 DIAGNOSIS — R001 Bradycardia, unspecified: Secondary | ICD-10-CM | POA: Diagnosis not present

## 2021-11-02 DIAGNOSIS — R5383 Other fatigue: Secondary | ICD-10-CM | POA: Diagnosis not present

## 2021-11-02 DIAGNOSIS — I10 Essential (primary) hypertension: Secondary | ICD-10-CM | POA: Diagnosis not present

## 2021-11-06 DIAGNOSIS — E1159 Type 2 diabetes mellitus with other circulatory complications: Secondary | ICD-10-CM | POA: Diagnosis not present

## 2021-11-06 DIAGNOSIS — I10 Essential (primary) hypertension: Secondary | ICD-10-CM | POA: Diagnosis not present

## 2021-11-06 DIAGNOSIS — E039 Hypothyroidism, unspecified: Secondary | ICD-10-CM | POA: Diagnosis not present

## 2021-11-06 DIAGNOSIS — J4540 Moderate persistent asthma, uncomplicated: Secondary | ICD-10-CM | POA: Diagnosis not present

## 2021-11-06 DIAGNOSIS — K219 Gastro-esophageal reflux disease without esophagitis: Secondary | ICD-10-CM | POA: Diagnosis not present

## 2021-11-06 DIAGNOSIS — M170 Bilateral primary osteoarthritis of knee: Secondary | ICD-10-CM | POA: Diagnosis not present

## 2021-11-09 DIAGNOSIS — R197 Diarrhea, unspecified: Secondary | ICD-10-CM | POA: Diagnosis not present

## 2021-11-16 DIAGNOSIS — I10 Essential (primary) hypertension: Secondary | ICD-10-CM | POA: Diagnosis not present

## 2021-11-16 DIAGNOSIS — G4733 Obstructive sleep apnea (adult) (pediatric): Secondary | ICD-10-CM | POA: Diagnosis not present

## 2021-11-30 DIAGNOSIS — T7840XD Allergy, unspecified, subsequent encounter: Secondary | ICD-10-CM | POA: Diagnosis not present

## 2021-11-30 DIAGNOSIS — I10 Essential (primary) hypertension: Secondary | ICD-10-CM | POA: Diagnosis not present

## 2021-12-12 DIAGNOSIS — K219 Gastro-esophageal reflux disease without esophagitis: Secondary | ICD-10-CM | POA: Diagnosis not present

## 2021-12-12 DIAGNOSIS — E039 Hypothyroidism, unspecified: Secondary | ICD-10-CM | POA: Diagnosis not present

## 2021-12-12 DIAGNOSIS — I10 Essential (primary) hypertension: Secondary | ICD-10-CM | POA: Diagnosis not present

## 2021-12-12 DIAGNOSIS — J4520 Mild intermittent asthma, uncomplicated: Secondary | ICD-10-CM | POA: Diagnosis not present

## 2021-12-12 DIAGNOSIS — E119 Type 2 diabetes mellitus without complications: Secondary | ICD-10-CM | POA: Diagnosis not present

## 2021-12-13 DIAGNOSIS — Z79891 Long term (current) use of opiate analgesic: Secondary | ICD-10-CM | POA: Diagnosis not present

## 2021-12-13 DIAGNOSIS — G894 Chronic pain syndrome: Secondary | ICD-10-CM | POA: Diagnosis not present

## 2021-12-13 DIAGNOSIS — M961 Postlaminectomy syndrome, not elsewhere classified: Secondary | ICD-10-CM | POA: Diagnosis not present

## 2021-12-13 DIAGNOSIS — M47816 Spondylosis without myelopathy or radiculopathy, lumbar region: Secondary | ICD-10-CM | POA: Diagnosis not present

## 2021-12-14 DIAGNOSIS — Z79891 Long term (current) use of opiate analgesic: Secondary | ICD-10-CM | POA: Diagnosis not present

## 2021-12-14 DIAGNOSIS — G894 Chronic pain syndrome: Secondary | ICD-10-CM | POA: Diagnosis not present

## 2021-12-21 DIAGNOSIS — J4541 Moderate persistent asthma with (acute) exacerbation: Secondary | ICD-10-CM | POA: Diagnosis not present

## 2021-12-24 DIAGNOSIS — M791 Myalgia, unspecified site: Secondary | ICD-10-CM | POA: Diagnosis not present

## 2021-12-24 DIAGNOSIS — G518 Other disorders of facial nerve: Secondary | ICD-10-CM | POA: Diagnosis not present

## 2021-12-24 DIAGNOSIS — M542 Cervicalgia: Secondary | ICD-10-CM | POA: Diagnosis not present

## 2021-12-24 DIAGNOSIS — G43719 Chronic migraine without aura, intractable, without status migrainosus: Secondary | ICD-10-CM | POA: Diagnosis not present

## 2021-12-28 DIAGNOSIS — M25552 Pain in left hip: Secondary | ICD-10-CM | POA: Diagnosis not present

## 2021-12-28 DIAGNOSIS — M25562 Pain in left knee: Secondary | ICD-10-CM | POA: Diagnosis not present

## 2021-12-28 DIAGNOSIS — M25561 Pain in right knee: Secondary | ICD-10-CM | POA: Diagnosis not present

## 2022-01-02 DIAGNOSIS — E876 Hypokalemia: Secondary | ICD-10-CM | POA: Diagnosis not present

## 2022-01-04 DIAGNOSIS — E1140 Type 2 diabetes mellitus with diabetic neuropathy, unspecified: Secondary | ICD-10-CM | POA: Diagnosis not present

## 2022-01-04 DIAGNOSIS — K219 Gastro-esophageal reflux disease without esophagitis: Secondary | ICD-10-CM | POA: Diagnosis not present

## 2022-01-04 DIAGNOSIS — I10 Essential (primary) hypertension: Secondary | ICD-10-CM | POA: Diagnosis not present

## 2022-01-04 DIAGNOSIS — E039 Hypothyroidism, unspecified: Secondary | ICD-10-CM | POA: Diagnosis not present

## 2022-01-04 DIAGNOSIS — J4541 Moderate persistent asthma with (acute) exacerbation: Secondary | ICD-10-CM | POA: Diagnosis not present

## 2022-01-04 DIAGNOSIS — E119 Type 2 diabetes mellitus without complications: Secondary | ICD-10-CM | POA: Diagnosis not present

## 2022-01-04 DIAGNOSIS — M170 Bilateral primary osteoarthritis of knee: Secondary | ICD-10-CM | POA: Diagnosis not present

## 2022-01-14 DIAGNOSIS — M542 Cervicalgia: Secondary | ICD-10-CM | POA: Diagnosis not present

## 2022-01-14 DIAGNOSIS — G43719 Chronic migraine without aura, intractable, without status migrainosus: Secondary | ICD-10-CM | POA: Diagnosis not present

## 2022-01-14 DIAGNOSIS — M791 Myalgia, unspecified site: Secondary | ICD-10-CM | POA: Diagnosis not present

## 2022-01-14 DIAGNOSIS — G518 Other disorders of facial nerve: Secondary | ICD-10-CM | POA: Diagnosis not present

## 2022-01-21 DIAGNOSIS — G4733 Obstructive sleep apnea (adult) (pediatric): Secondary | ICD-10-CM | POA: Diagnosis not present

## 2022-02-04 DIAGNOSIS — M791 Myalgia, unspecified site: Secondary | ICD-10-CM | POA: Diagnosis not present

## 2022-02-04 DIAGNOSIS — G518 Other disorders of facial nerve: Secondary | ICD-10-CM | POA: Diagnosis not present

## 2022-02-04 DIAGNOSIS — M542 Cervicalgia: Secondary | ICD-10-CM | POA: Diagnosis not present

## 2022-02-04 DIAGNOSIS — G43719 Chronic migraine without aura, intractable, without status migrainosus: Secondary | ICD-10-CM | POA: Diagnosis not present

## 2022-02-10 DIAGNOSIS — R252 Cramp and spasm: Secondary | ICD-10-CM | POA: Diagnosis not present

## 2022-02-10 DIAGNOSIS — J0140 Acute pansinusitis, unspecified: Secondary | ICD-10-CM | POA: Diagnosis not present

## 2022-02-10 DIAGNOSIS — H6692 Otitis media, unspecified, left ear: Secondary | ICD-10-CM | POA: Diagnosis not present

## 2022-02-14 DIAGNOSIS — J4541 Moderate persistent asthma with (acute) exacerbation: Secondary | ICD-10-CM | POA: Diagnosis not present

## 2022-02-14 DIAGNOSIS — E039 Hypothyroidism, unspecified: Secondary | ICD-10-CM | POA: Diagnosis not present

## 2022-02-14 DIAGNOSIS — K219 Gastro-esophageal reflux disease without esophagitis: Secondary | ICD-10-CM | POA: Diagnosis not present

## 2022-02-14 DIAGNOSIS — M170 Bilateral primary osteoarthritis of knee: Secondary | ICD-10-CM | POA: Diagnosis not present

## 2022-02-14 DIAGNOSIS — I10 Essential (primary) hypertension: Secondary | ICD-10-CM | POA: Diagnosis not present

## 2022-02-21 DIAGNOSIS — N3281 Overactive bladder: Secondary | ICD-10-CM | POA: Diagnosis not present

## 2022-02-25 ENCOUNTER — Encounter: Payer: Self-pay | Admitting: Podiatry

## 2022-02-25 ENCOUNTER — Ambulatory Visit: Payer: Medicare Other | Admitting: Podiatry

## 2022-02-25 DIAGNOSIS — M79675 Pain in left toe(s): Secondary | ICD-10-CM | POA: Diagnosis not present

## 2022-02-25 DIAGNOSIS — L600 Ingrowing nail: Secondary | ICD-10-CM | POA: Diagnosis not present

## 2022-02-25 NOTE — Progress Notes (Signed)
Subjective:   Patient ID: Melinda Sanford, female   DOB: 76 y.o.   MRN: [id number]   HPI Patient had a nail removed approximate 6 months ago and had a fake nail put on that she did not expect to be put on and states that she has discomfort and discoloration cannot take the nail off and it is becoming more painful   ROS      Objective:  Physical Exam  Neuro vascular status intact with a artificial nail on the dorsal left nailbed that is partially on partially off painful when pressed     Assessment:  Abnormal artificial nail left hallux that was put on it appropriately     Plan:  H&P reviewed condition at great length.  At this point I anesthetized the left hallux 60 mg like Marcaine mixture using sterile instrumentation removed the hallux nail and cleaned of the nailbed no abnormal pathology was noted applied sterile dressing instructed on soaks reappoint as indicated may require permanent procedure at 1 point future

## 2022-03-08 DIAGNOSIS — G894 Chronic pain syndrome: Secondary | ICD-10-CM | POA: Diagnosis not present

## 2022-03-08 DIAGNOSIS — Z79891 Long term (current) use of opiate analgesic: Secondary | ICD-10-CM | POA: Diagnosis not present

## 2022-03-08 DIAGNOSIS — M47816 Spondylosis without myelopathy or radiculopathy, lumbar region: Secondary | ICD-10-CM | POA: Diagnosis not present

## 2022-03-08 DIAGNOSIS — G43719 Chronic migraine without aura, intractable, without status migrainosus: Secondary | ICD-10-CM | POA: Diagnosis not present

## 2022-03-08 DIAGNOSIS — M961 Postlaminectomy syndrome, not elsewhere classified: Secondary | ICD-10-CM | POA: Diagnosis not present

## 2022-03-08 DIAGNOSIS — M542 Cervicalgia: Secondary | ICD-10-CM | POA: Diagnosis not present

## 2022-03-08 DIAGNOSIS — M791 Myalgia, unspecified site: Secondary | ICD-10-CM | POA: Diagnosis not present

## 2022-03-29 DIAGNOSIS — M791 Myalgia, unspecified site: Secondary | ICD-10-CM | POA: Diagnosis not present

## 2022-03-29 DIAGNOSIS — G43719 Chronic migraine without aura, intractable, without status migrainosus: Secondary | ICD-10-CM | POA: Diagnosis not present

## 2022-03-29 DIAGNOSIS — M542 Cervicalgia: Secondary | ICD-10-CM | POA: Diagnosis not present

## 2022-04-05 ENCOUNTER — Other Ambulatory Visit: Payer: Self-pay

## 2022-04-05 DIAGNOSIS — I10 Essential (primary) hypertension: Secondary | ICD-10-CM | POA: Diagnosis not present

## 2022-04-05 DIAGNOSIS — E559 Vitamin D deficiency, unspecified: Secondary | ICD-10-CM | POA: Diagnosis not present

## 2022-04-05 DIAGNOSIS — B079 Viral wart, unspecified: Secondary | ICD-10-CM | POA: Diagnosis not present

## 2022-04-05 DIAGNOSIS — K219 Gastro-esophageal reflux disease without esophagitis: Secondary | ICD-10-CM | POA: Diagnosis not present

## 2022-04-05 DIAGNOSIS — J4541 Moderate persistent asthma with (acute) exacerbation: Secondary | ICD-10-CM | POA: Diagnosis not present

## 2022-04-05 DIAGNOSIS — E876 Hypokalemia: Secondary | ICD-10-CM | POA: Diagnosis not present

## 2022-04-05 DIAGNOSIS — R252 Cramp and spasm: Secondary | ICD-10-CM | POA: Diagnosis not present

## 2022-04-05 DIAGNOSIS — J4540 Moderate persistent asthma, uncomplicated: Secondary | ICD-10-CM | POA: Diagnosis not present

## 2022-04-05 DIAGNOSIS — E1140 Type 2 diabetes mellitus with diabetic neuropathy, unspecified: Secondary | ICD-10-CM | POA: Diagnosis not present

## 2022-04-05 DIAGNOSIS — M170 Bilateral primary osteoarthritis of knee: Secondary | ICD-10-CM | POA: Diagnosis not present

## 2022-04-05 DIAGNOSIS — Z23 Encounter for immunization: Secondary | ICD-10-CM | POA: Diagnosis not present

## 2022-04-05 DIAGNOSIS — E039 Hypothyroidism, unspecified: Secondary | ICD-10-CM | POA: Diagnosis not present

## 2022-04-05 MED ORDER — COVID-19 MRNA 2023-2024 VACCINE (COMIRNATY) 0.3 ML INJECTION
0.3000 mL | INTRAMUSCULAR | 0 refills | Status: DC
  Filled 2022-04-05: qty 0.3, 1d supply, fill #0

## 2022-04-11 NOTE — Progress Notes (Signed)
HPI F never smoker followed for OSA, complicated by HTN, Chronic bilateral Pleural Effusions, Hypokalemia, DM2  HST 01/01/20- AHI 9.8/ hr, desaturation to 82%, body weight 202 lbs  ==========================================================   09/14/20- 63 yoF never smoker followed for OSA, complicated by HTN, Chronic bilateral Pleural Effusions, Hypokalemia, DM2  Albuterol hfa, Symbicort 160, Flonase,  CPAP auto 5-15/ [employer] Download-compliance  100%, AHI 6/ hr Body weight today-204 lbs Covid vax-3 Phizer Flu vax-had -----Patient states that she is feeling ok overall, sleeping good and machine is working good.  She switched to medium nasal pillows and comfortable with that. Sleeping well. Reviewed download. Using CPAP has stopped morning headaches for which she was taking Topamax.  Getting bills she doesn't understand from [place]. She works with [employer]. I suggested she make appt to talk directly with manager at the [place] office. Uses chair lift at home for arthritis knees. Water aerobics.  04/12/22- 75 yoF never smoker followed for OSA, complicated by HTN, Chronic bilateral Pleural Effusions, Hypokalemia, DM2, Arthritis, GERD, Morbid Obesity,  -Albuterol hfa, Symbicort 160, Flonase,  CPAP auto 5-15/ [facility]       AirSense 11/ AutoSet Download-compliance  57%, AHI 6/hr      (pressure range 10.1- 13.7) Body weight today-168 lbs Covid vax-3 Phizer, J&J Flu vax- had Download reviewed- lot of short nights. -----Having issues with CPAP mask, DME company states she can have a new mask on November 7th Bothered by dry mouth x 1 year. Uses nasal masks- will try chin strap and adjust humidifier. Chronic intermittent diffuse chest tightness/ soreness. Not clearly exertional.  She thinks it may be known heartburn. Uses Tums. I asked her to discuss with PCP, but in the meantime try Pepcid-AC.  ROS-see HPI   + = positive Constitutional:    weight loss, night sweats, fevers, chills,  fatigue, lassitude. HEENT:    +headaches, difficulty swallowing, +tooth/dental problems, sore throat,       +sneezing, itching, ear ache, +nasal congestion, post nasal drip, snoring CV:    chest pain, orthopnea, PND, swelling in lower extremities, anasarca,                                   dizziness, palpitations Resp:   +shortness of breath with exertion or at rest.                productive cough,   non-productive cough, coughing up of blood.              change in color of mucus.  wheezing.   Skin:    rash or lesions. GI:  No-   heartburn, indigestion, abdominal pain, nausea, vomiting, diarrhea,                 change in bowel habits, loss of appetite GU: dysuria, change in color of urine, no urgency or frequency.   flank pain. MS:  + joint pain, stiffness, decreased range of motion, back pain. Neuro-     nothing unusual Psych:  change in mood or affect.  depression or anxiety.   memory loss.  OBJ- Physical Exam General- Alert, Oriented, Affect-appropriate, Distress- none acute, + obese Skin- rash-none, lesions- none, excoriation- none    Lymphadenopathy- none Head- atraumatic            Eyes- Gross vision intact, PERRLA, conjunctivae and secretions clear            Ears- Hearing, canals-normal  Nose- Clear, no-Septal dev, mucus, polyps, erosion, perforation             Throat- Mallampati III , mucosa clear , drainage- none, tonsils- atrophic, + teeth Neck- flexible , trachea midline, no stridor , thyroid nl, carotid no bruit Chest - symmetrical excursion , unlabored           Heart/CV- RRR , no murmur , no gallop  , no rub, nl s1 s2                           - JVD- none , edema- none, stasis changes- none, varices- none           Lung- clear to P&A, wheeze- none, cough- none , dullness-none, rub- none           Chest wall-  Abd-  Br/ Gen/ Rectal- Not done, not indicated Extrem- cyanosis- none, clubbing, none, atrophy- none, strength- nl Neuro- grossly intact to  observation

## 2022-04-12 ENCOUNTER — Encounter: Payer: Self-pay | Admitting: Internal Medicine

## 2022-04-12 ENCOUNTER — Ambulatory Visit: Payer: Medicare Other | Admitting: Internal Medicine

## 2022-04-12 VITALS — BP 160/92 | HR 88 | Ht 64.0 in | Wt 168.6 lb

## 2022-04-12 DIAGNOSIS — K219 Gastro-esophageal reflux disease without esophagitis: Secondary | ICD-10-CM | POA: Diagnosis not present

## 2022-04-12 DIAGNOSIS — G4733 Obstructive sleep apnea (adult) (pediatric): Secondary | ICD-10-CM

## 2022-04-12 NOTE — Assessment & Plan Note (Signed)
Benefits from CPAP when used. Discussed comfort and compliance. Plan- chin strap, adjust humidifier. Reassess next visit.

## 2022-04-12 NOTE — Assessment & Plan Note (Signed)
I think she is describing GERD. Tums seem to help. Not sure that is all that's going on, so asked her to discuss with PCP Plan- Try Pepcid AC, but discuss with PCP

## 2022-04-12 NOTE — Patient Instructions (Signed)
Order- schedule mask fitting/ desensitization at sleep center  Order- DME [employer]- please provide chin strap and teach patient how to adjust humidifier  For acid indigestion/ heart burn- Try otc Pepcid-AC     take 1 every day before breakfast

## 2022-04-17 ENCOUNTER — Telehealth: Payer: Self-pay | Admitting: Internal Medicine

## 2022-04-17 NOTE — Telephone Encounter (Signed)
Pt called stating that she missed phone call about mask fitting appt. I stated to pt when her appt for the mask fit was and she verbalized understanding. Nothing further needed.

## 2022-04-21 DIAGNOSIS — G4733 Obstructive sleep apnea (adult) (pediatric): Secondary | ICD-10-CM | POA: Diagnosis not present

## 2022-04-26 DIAGNOSIS — M25561 Pain in right knee: Secondary | ICD-10-CM | POA: Diagnosis not present

## 2022-04-26 DIAGNOSIS — M25562 Pain in left knee: Secondary | ICD-10-CM | POA: Diagnosis not present

## 2022-05-02 DIAGNOSIS — I10 Essential (primary) hypertension: Secondary | ICD-10-CM | POA: Diagnosis not present

## 2022-05-02 DIAGNOSIS — M546 Pain in thoracic spine: Secondary | ICD-10-CM | POA: Diagnosis not present

## 2022-05-02 DIAGNOSIS — E1159 Type 2 diabetes mellitus with other circulatory complications: Secondary | ICD-10-CM | POA: Diagnosis not present

## 2022-05-03 DIAGNOSIS — G894 Chronic pain syndrome: Secondary | ICD-10-CM | POA: Diagnosis not present

## 2022-05-03 DIAGNOSIS — M47816 Spondylosis without myelopathy or radiculopathy, lumbar region: Secondary | ICD-10-CM | POA: Diagnosis not present

## 2022-05-03 DIAGNOSIS — Z79891 Long term (current) use of opiate analgesic: Secondary | ICD-10-CM | POA: Diagnosis not present

## 2022-05-03 DIAGNOSIS — M961 Postlaminectomy syndrome, not elsewhere classified: Secondary | ICD-10-CM | POA: Diagnosis not present

## 2022-05-06 ENCOUNTER — Ambulatory Visit (HOSPITAL_BASED_OUTPATIENT_CLINIC_OR_DEPARTMENT_OTHER): Payer: Medicare Other | Attending: Internal Medicine | Admitting: Radiology

## 2022-05-06 DIAGNOSIS — G43719 Chronic migraine without aura, intractable, without status migrainosus: Secondary | ICD-10-CM | POA: Diagnosis not present

## 2022-05-06 DIAGNOSIS — M542 Cervicalgia: Secondary | ICD-10-CM | POA: Diagnosis not present

## 2022-05-06 DIAGNOSIS — G4733 Obstructive sleep apnea (adult) (pediatric): Secondary | ICD-10-CM

## 2022-05-06 DIAGNOSIS — M791 Myalgia, unspecified site: Secondary | ICD-10-CM | POA: Diagnosis not present

## 2022-05-16 DIAGNOSIS — E039 Hypothyroidism, unspecified: Secondary | ICD-10-CM | POA: Diagnosis not present

## 2022-05-16 DIAGNOSIS — K219 Gastro-esophageal reflux disease without esophagitis: Secondary | ICD-10-CM | POA: Diagnosis not present

## 2022-05-16 DIAGNOSIS — E1140 Type 2 diabetes mellitus with diabetic neuropathy, unspecified: Secondary | ICD-10-CM | POA: Diagnosis not present

## 2022-05-16 DIAGNOSIS — M170 Bilateral primary osteoarthritis of knee: Secondary | ICD-10-CM | POA: Diagnosis not present

## 2022-05-16 DIAGNOSIS — I10 Essential (primary) hypertension: Secondary | ICD-10-CM | POA: Diagnosis not present

## 2022-05-16 DIAGNOSIS — J4541 Moderate persistent asthma with (acute) exacerbation: Secondary | ICD-10-CM | POA: Diagnosis not present

## 2022-05-30 DIAGNOSIS — M542 Cervicalgia: Secondary | ICD-10-CM | POA: Diagnosis not present

## 2022-05-30 DIAGNOSIS — M791 Myalgia, unspecified site: Secondary | ICD-10-CM | POA: Diagnosis not present

## 2022-05-30 DIAGNOSIS — G43719 Chronic migraine without aura, intractable, without status migrainosus: Secondary | ICD-10-CM | POA: Diagnosis not present

## 2022-06-14 DIAGNOSIS — J4541 Moderate persistent asthma with (acute) exacerbation: Secondary | ICD-10-CM | POA: Diagnosis not present

## 2022-06-14 DIAGNOSIS — I10 Essential (primary) hypertension: Secondary | ICD-10-CM | POA: Diagnosis not present

## 2022-06-14 DIAGNOSIS — Z1231 Encounter for screening mammogram for malignant neoplasm of breast: Secondary | ICD-10-CM | POA: Diagnosis not present

## 2022-06-14 DIAGNOSIS — E039 Hypothyroidism, unspecified: Secondary | ICD-10-CM | POA: Diagnosis not present

## 2022-06-14 DIAGNOSIS — M170 Bilateral primary osteoarthritis of knee: Secondary | ICD-10-CM | POA: Diagnosis not present

## 2022-06-14 DIAGNOSIS — E1140 Type 2 diabetes mellitus with diabetic neuropathy, unspecified: Secondary | ICD-10-CM | POA: Diagnosis not present

## 2022-06-14 DIAGNOSIS — K219 Gastro-esophageal reflux disease without esophagitis: Secondary | ICD-10-CM | POA: Diagnosis not present

## 2022-06-19 DIAGNOSIS — M542 Cervicalgia: Secondary | ICD-10-CM | POA: Diagnosis not present

## 2022-06-19 DIAGNOSIS — G43719 Chronic migraine without aura, intractable, without status migrainosus: Secondary | ICD-10-CM | POA: Diagnosis not present

## 2022-06-19 DIAGNOSIS — M791 Myalgia, unspecified site: Secondary | ICD-10-CM | POA: Diagnosis not present

## 2022-06-20 DIAGNOSIS — N3281 Overactive bladder: Secondary | ICD-10-CM | POA: Diagnosis not present

## 2022-06-20 DIAGNOSIS — Z538 Procedure and treatment not carried out for other reasons: Secondary | ICD-10-CM | POA: Diagnosis not present

## 2022-07-04 DIAGNOSIS — N3281 Overactive bladder: Secondary | ICD-10-CM | POA: Diagnosis not present

## 2022-07-04 DIAGNOSIS — N3289 Other specified disorders of bladder: Secondary | ICD-10-CM | POA: Diagnosis not present

## 2022-07-05 DIAGNOSIS — M47816 Spondylosis without myelopathy or radiculopathy, lumbar region: Secondary | ICD-10-CM | POA: Diagnosis not present

## 2022-07-05 DIAGNOSIS — M961 Postlaminectomy syndrome, not elsewhere classified: Secondary | ICD-10-CM | POA: Diagnosis not present

## 2022-07-05 DIAGNOSIS — Z79891 Long term (current) use of opiate analgesic: Secondary | ICD-10-CM | POA: Diagnosis not present

## 2022-07-05 DIAGNOSIS — G894 Chronic pain syndrome: Secondary | ICD-10-CM | POA: Diagnosis not present

## 2022-07-15 DIAGNOSIS — M170 Bilateral primary osteoarthritis of knee: Secondary | ICD-10-CM | POA: Diagnosis not present

## 2022-07-15 DIAGNOSIS — E039 Hypothyroidism, unspecified: Secondary | ICD-10-CM | POA: Diagnosis not present

## 2022-07-15 DIAGNOSIS — E1140 Type 2 diabetes mellitus with diabetic neuropathy, unspecified: Secondary | ICD-10-CM | POA: Diagnosis not present

## 2022-07-15 DIAGNOSIS — I10 Essential (primary) hypertension: Secondary | ICD-10-CM | POA: Diagnosis not present

## 2022-07-15 DIAGNOSIS — J4540 Moderate persistent asthma, uncomplicated: Secondary | ICD-10-CM | POA: Diagnosis not present

## 2022-07-15 DIAGNOSIS — K219 Gastro-esophageal reflux disease without esophagitis: Secondary | ICD-10-CM | POA: Diagnosis not present

## 2022-07-18 DIAGNOSIS — G43719 Chronic migraine without aura, intractable, without status migrainosus: Secondary | ICD-10-CM | POA: Diagnosis not present

## 2022-07-18 DIAGNOSIS — M791 Myalgia, unspecified site: Secondary | ICD-10-CM | POA: Diagnosis not present

## 2022-07-18 DIAGNOSIS — M542 Cervicalgia: Secondary | ICD-10-CM | POA: Diagnosis not present

## 2022-07-22 DIAGNOSIS — G4733 Obstructive sleep apnea (adult) (pediatric): Secondary | ICD-10-CM | POA: Diagnosis not present

## 2022-07-26 DIAGNOSIS — M25562 Pain in left knee: Secondary | ICD-10-CM | POA: Diagnosis not present

## 2022-07-26 DIAGNOSIS — M25561 Pain in right knee: Secondary | ICD-10-CM | POA: Diagnosis not present

## 2022-07-29 IMAGING — MG DIGITAL DIAGNOSTIC BILAT W/ TOMO W/ CAD
6 of 10 series · 6 of 30 positions shown · non-contrast
Comparison: Previous exam(s).

CLINICAL DATA: Patient presents for palpable abnormality within the
outer right breast.

EXAM:
DIGITAL DIAGNOSTIC BILATERAL MAMMOGRAM WITH TOMOSYNTHESIS AND CAD;
ULTRASOUND RIGHT BREAST LIMITED
TECHNIQUE: Bilateral digital diagnostic mammography and breast tomosynthesis
was performed. The images were evaluated with computer-aided
detection.; Targeted ultrasound examination of the right breast was
performed

[R MLO synth-2D]
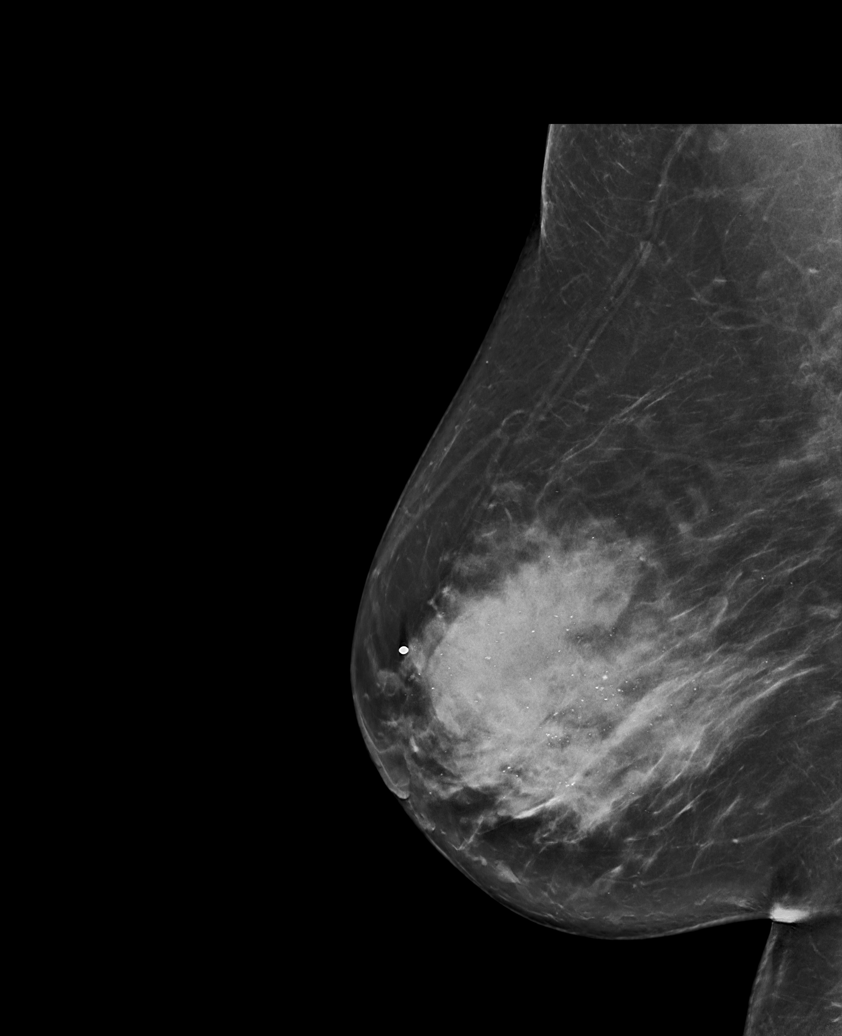

[R TAN synth-2D]
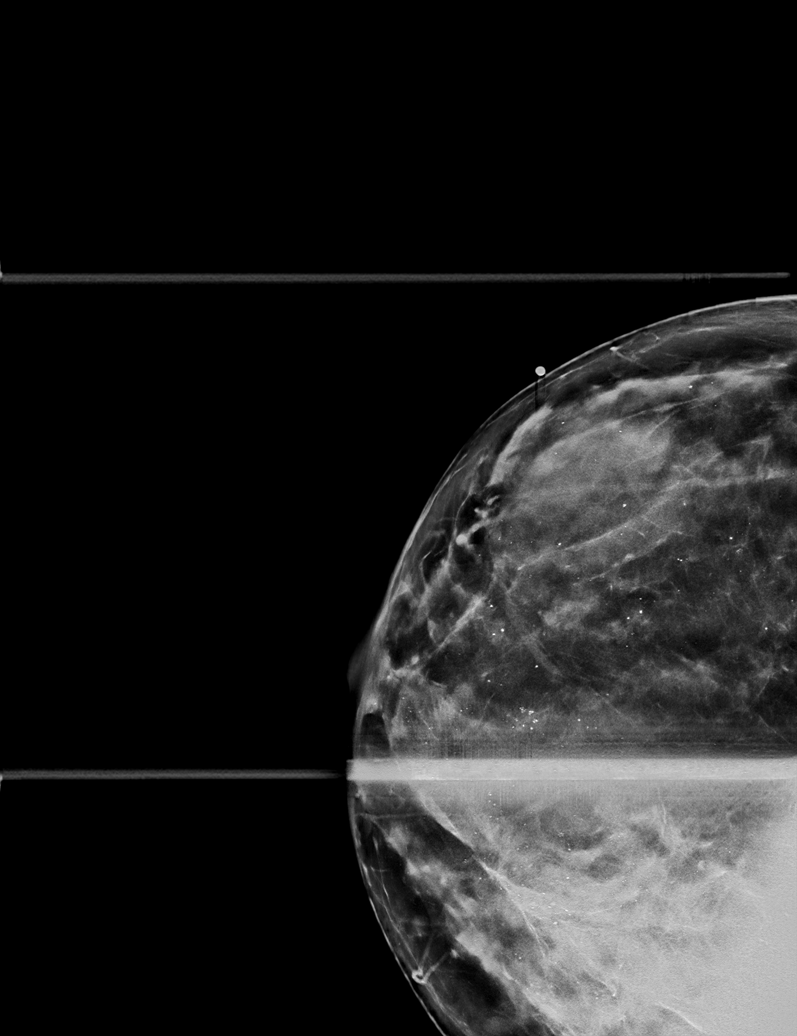

[R CC synth-2D]
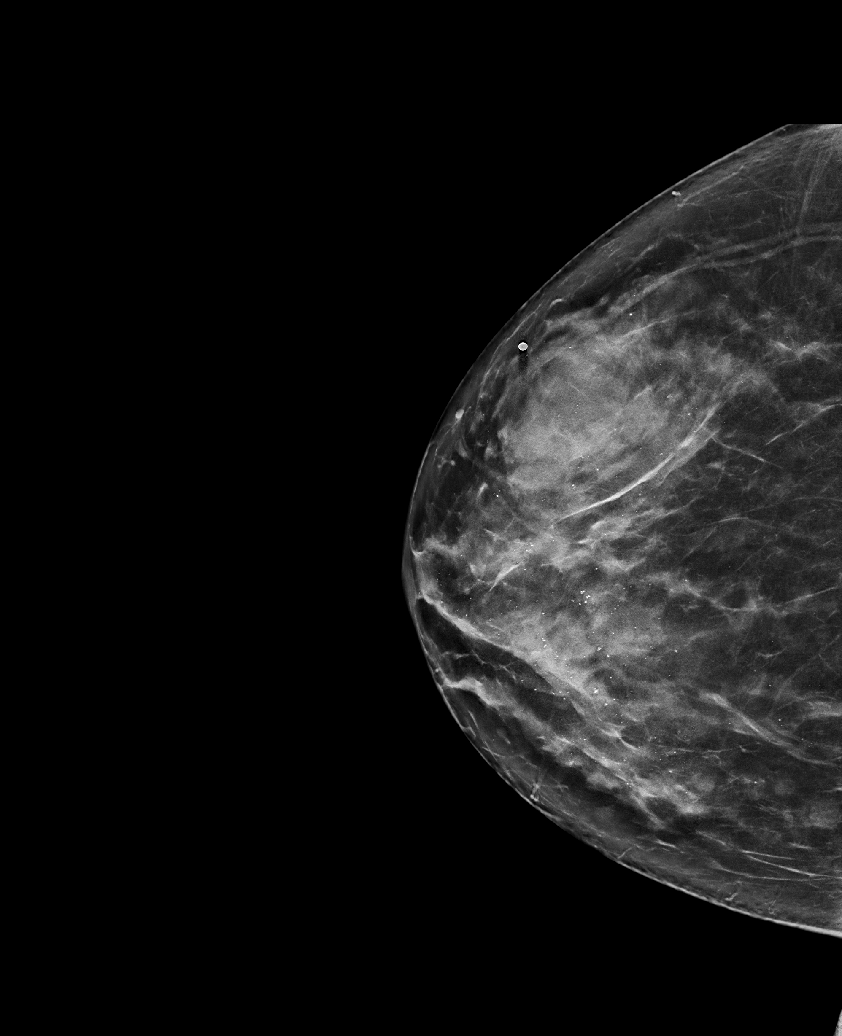

[L MLO synth-2D]
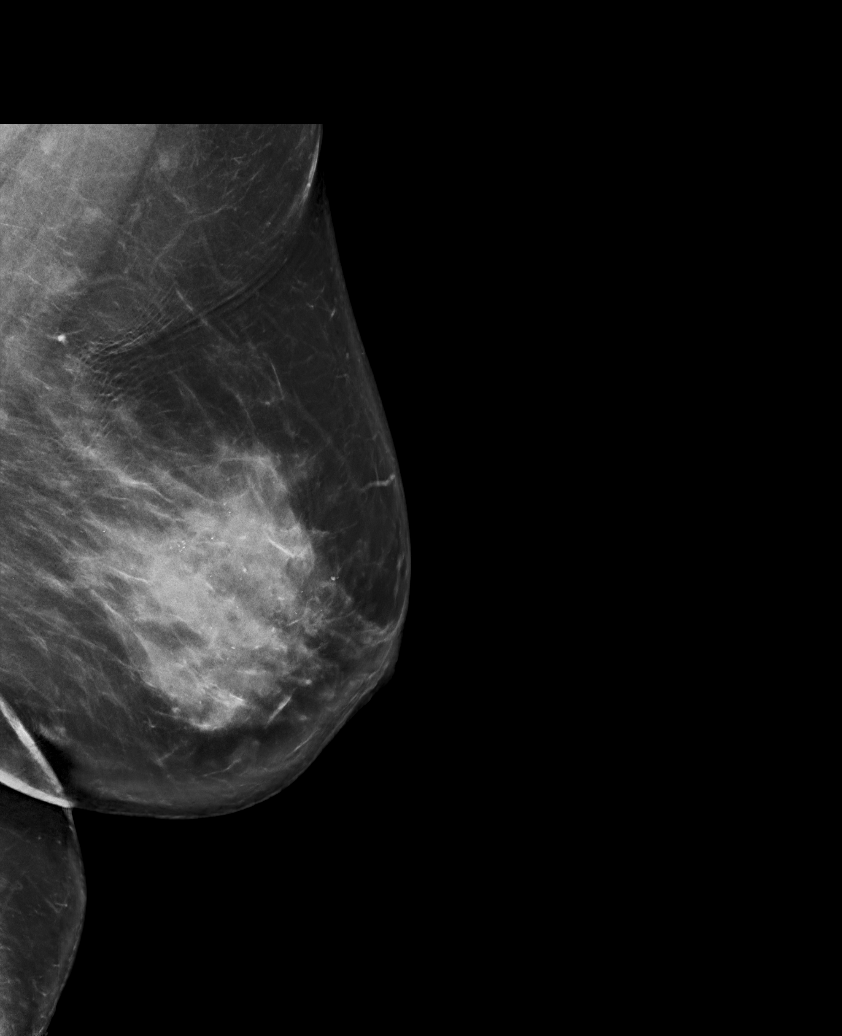

[L CC synth-2D]
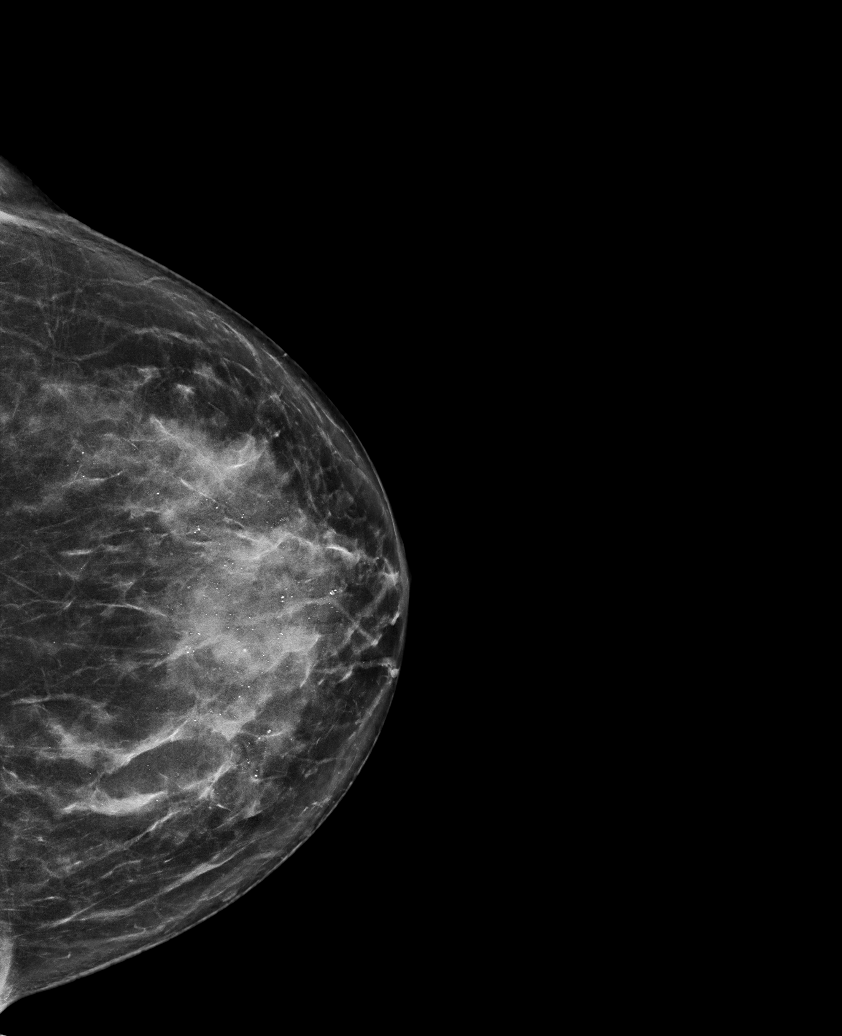

[L MLO tomo · tomo slice 51/100.0]
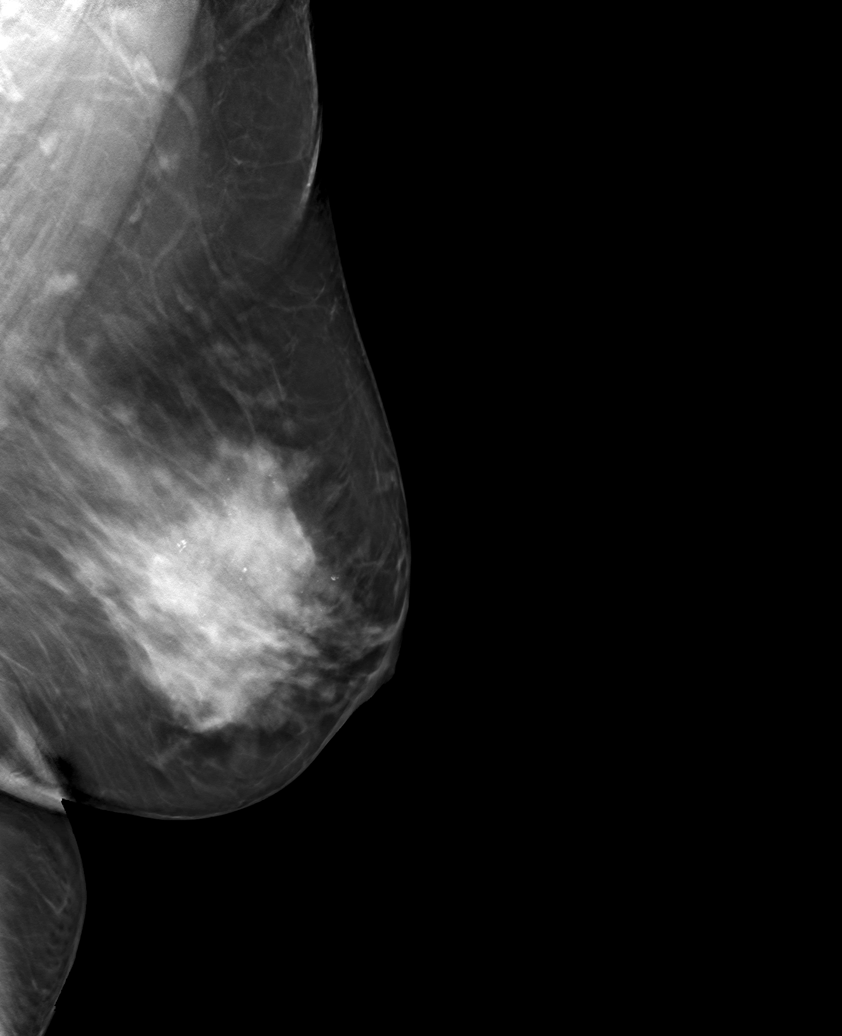

[6 of 30 positions shown; findings below may reference images not displayed]

ACR Breast Density Category d: The breast tissue is extremely dense,
which lowers the sensitivity of mammography.
FINDINGS: Within the outer right breast middle depth underlying the palpable
marker is a partially obscured mass. No additional concerning
masses, calcifications or distortion identified within either
breast.

On physical exam, there is a palpable mass within the outer right
breast.

Targeted ultrasound is performed, showing a 3.9 x 3.2 x 2.7 cm cyst
right breast 9 o'clock position 4 cm from the nipple the site of
palpable concern.
IMPRESSION: Palpable abnormality right breast corresponds with a benign simple
cyst.

No mammographic evidence for malignancy.

RECOMMENDATION:
Screening mammogram in one year.(Code:AG-R-3N2)

I have discussed the findings and recommendations with the patient.
If applicable, a reminder letter will be sent to the patient
regarding the next appointment.

BI-RADS CATEGORY  2: Benign.

## 2022-08-09 DIAGNOSIS — E039 Hypothyroidism, unspecified: Secondary | ICD-10-CM | POA: Diagnosis not present

## 2022-08-09 DIAGNOSIS — J4541 Moderate persistent asthma with (acute) exacerbation: Secondary | ICD-10-CM | POA: Diagnosis not present

## 2022-08-09 DIAGNOSIS — E1140 Type 2 diabetes mellitus with diabetic neuropathy, unspecified: Secondary | ICD-10-CM | POA: Diagnosis not present

## 2022-08-11 NOTE — Progress Notes (Signed)
HPI F never smoker followed for OSA, complicated by HTN, Chronic bilateral Pleural Effusions, Hypokalemia, DM2  HST 01/01/20- AHI 9.8/ hr, desaturation to 82%, body weight 202 lbs  ==========================================================    04/12/22- 46 yoF never smoker followed for OSA, complicated by HTN, Chronic bilateral Pleural Effusions, Hypokalemia, DM2, Arthritis, GERD, Morbid Obesity,  -Albuterol hfa, Symbicort 160, Flonase,  CPAP auto 5-15/ [facility]       AirSense 11/ AutoSet Download-compliance  57%, AHI 6/hr      (pressure range 10.1- 13.7) Body weight today-168 lbs Covid vax-3 Phizer, J&J Flu vax- had Download reviewed- lot of short nights. -----Having issues with CPAP mask, DME company states she can have a new mask on November 7th Bothered by dry mouth x 1 year. Uses nasal masks- will try chin strap and adjust humidifier. Chronic intermittent diffuse chest tightness/ soreness. Not clearly exertional.  She thinks it may be known heartburn. Uses Tums. I asked her to discuss with PCP, but in the meantime try Pepcid-AC.  08/13/22- 76 yoF never smoker followed for OSA, complicated by HTN, Chronic bilateral Pleural Effusions, Hypokalemia, DM2, Arthritis, GERD, Morbid Obesity, Depression, Asthmatic Bronchitis,  -Albuterol hfa, Symbicort 160, Flonase,  CPAP auto 5-15/ [facility]       AirSense 11/ AutoSet Download-compliance 53%, AHI 5.2/ hr Body weight today-162 bs Covid vax-4 Phizer, J&J Flu vax- had She states she has lost 40 pounds since 2021, crediting attention to diabetes control.  Our records indicate 6 pound weight loss since October.  She now finds she is sleeping better without CPAP discussed importance of weight control for that.  Plan admits some dyspnea on exertion and slight wheeze.  Rescue inhaler helps more than Symbicort, using rescue 1 time daily and aware of pursed lip breathing technique.  We discussed alternatives and will let her try Breztri. She is interested in  updating sleep study off CPAP.   ROS-see HPI   + = positive Constitutional:    weight loss, night sweats, fevers, chills, fatigue, lassitude. HEENT:    +headaches, difficulty swallowing, +tooth/dental problems, sore throat,       +sneezing, itching, ear ache, +nasal congestion, post nasal drip, snoring CV:    chest pain, orthopnea, PND, swelling in lower extremities, anasarca,                                   dizziness, palpitations Resp:   +shortness of breath with exertion or at rest.                productive cough,   non-productive cough, coughing up of blood.              change in color of mucus.  wheezing.   Skin:    rash or lesions. GI:  No-   heartburn, indigestion, abdominal pain, nausea, vomiting, diarrhea,                 change in bowel habits, loss of appetite GU: dysuria, change in color of urine, no urgency or frequency.   flank pain. MS:  + joint pain, stiffness, decreased range of motion, back pain. Neuro-     nothing unusual Psych:  change in mood or affect.  depression or anxiety.   memory loss.  OBJ- Physical Exam General- Alert, Oriented, Affect-appropriate, Distress- none acute,  Skin- rash-none, lesions- none, excoriation- none    Lymphadenopathy- none Head- atraumatic  Eyes- Gross vision intact, PERRLA, conjunctivae and secretions clear            Ears- Hearing, canals-normal            Nose- Clear, no-Septal dev, mucus, polyps, erosion, perforation             Throat- Mallampati III , mucosa clear , drainage- none, tonsils- atrophic, + teeth Neck- flexible , trachea midline, no stridor , thyroid nl, carotid no bruit Chest - symmetrical excursion , unlabored           Heart/CV- RRR , no murmur , no gallop  , no rub, nl s1 s2                           - JVD- none , edema- none, stasis changes- none, varices- none           Lung- clear to P&A, wheeze- none, cough- none , dullness-none, rub- none           Chest wall-  Abd-  Br/ Gen/ Rectal- Not  done, not indicated Extrem- cyanosis- none, clubbing, none, atrophy- none, strength- nl Neuro- grossly intact to observation

## 2022-08-13 ENCOUNTER — Encounter: Payer: Self-pay | Admitting: Internal Medicine

## 2022-08-13 ENCOUNTER — Ambulatory Visit (INDEPENDENT_AMBULATORY_CARE_PROVIDER_SITE_OTHER): Payer: Medicare Other

## 2022-08-13 ENCOUNTER — Ambulatory Visit: Payer: Medicare Other | Admitting: Internal Medicine

## 2022-08-13 VITALS — BP 138/80 | HR 78 | Ht 64.0 in | Wt 162.4 lb

## 2022-08-13 DIAGNOSIS — J4520 Mild intermittent asthma, uncomplicated: Secondary | ICD-10-CM | POA: Diagnosis not present

## 2022-08-13 DIAGNOSIS — R0609 Other forms of dyspnea: Secondary | ICD-10-CM | POA: Diagnosis not present

## 2022-08-13 DIAGNOSIS — R0602 Shortness of breath: Secondary | ICD-10-CM | POA: Diagnosis not present

## 2022-08-13 DIAGNOSIS — G4733 Obstructive sleep apnea (adult) (pediatric): Secondary | ICD-10-CM

## 2022-08-13 MED ORDER — BREZTRI AEROSPHERE 160-9-4.8 MCG/ACT IN AERO: 2.0000 | INHALATION_SPRAY | Freq: Two times a day (BID) | RESPIRATORY_TRACT | 0 refills | Status: DC

## 2022-08-13 NOTE — Patient Instructions (Signed)
Order- schedule Home Sleep Test off CPAP     dx OSA  Order- sample x 2 Breztri inhaler     inhale 2 puffs then rinse mouth, twice daily    Try this instead of Symbicort  Order- CXR     dx dyspnea on exertion

## 2022-08-14 DIAGNOSIS — E039 Hypothyroidism, unspecified: Secondary | ICD-10-CM | POA: Diagnosis not present

## 2022-08-14 DIAGNOSIS — J4541 Moderate persistent asthma with (acute) exacerbation: Secondary | ICD-10-CM | POA: Diagnosis not present

## 2022-08-14 DIAGNOSIS — K219 Gastro-esophageal reflux disease without esophagitis: Secondary | ICD-10-CM | POA: Diagnosis not present

## 2022-08-14 DIAGNOSIS — I10 Essential (primary) hypertension: Secondary | ICD-10-CM | POA: Diagnosis not present

## 2022-08-14 DIAGNOSIS — M170 Bilateral primary osteoarthritis of knee: Secondary | ICD-10-CM | POA: Diagnosis not present

## 2022-08-14 DIAGNOSIS — E1140 Type 2 diabetes mellitus with diabetic neuropathy, unspecified: Secondary | ICD-10-CM | POA: Diagnosis not present

## 2022-08-15 DIAGNOSIS — M542 Cervicalgia: Secondary | ICD-10-CM | POA: Diagnosis not present

## 2022-08-15 DIAGNOSIS — G43719 Chronic migraine without aura, intractable, without status migrainosus: Secondary | ICD-10-CM | POA: Diagnosis not present

## 2022-08-15 DIAGNOSIS — M791 Myalgia, unspecified site: Secondary | ICD-10-CM | POA: Diagnosis not present

## 2022-08-22 ENCOUNTER — Ambulatory Visit: Payer: Medicare Other

## 2022-08-22 DIAGNOSIS — R0683 Snoring: Secondary | ICD-10-CM

## 2022-08-22 DIAGNOSIS — G4733 Obstructive sleep apnea (adult) (pediatric): Secondary | ICD-10-CM

## 2022-08-26 DIAGNOSIS — I10 Essential (primary) hypertension: Secondary | ICD-10-CM | POA: Diagnosis not present

## 2022-08-26 DIAGNOSIS — E1140 Type 2 diabetes mellitus with diabetic neuropathy, unspecified: Secondary | ICD-10-CM | POA: Diagnosis not present

## 2022-08-28 DIAGNOSIS — G43719 Chronic migraine without aura, intractable, without status migrainosus: Secondary | ICD-10-CM | POA: Diagnosis not present

## 2022-08-28 DIAGNOSIS — R0683 Snoring: Secondary | ICD-10-CM | POA: Diagnosis not present

## 2022-08-28 DIAGNOSIS — M791 Myalgia, unspecified site: Secondary | ICD-10-CM | POA: Diagnosis not present

## 2022-08-28 DIAGNOSIS — M542 Cervicalgia: Secondary | ICD-10-CM | POA: Diagnosis not present

## 2022-09-02 DIAGNOSIS — K219 Gastro-esophageal reflux disease without esophagitis: Secondary | ICD-10-CM | POA: Diagnosis not present

## 2022-09-02 DIAGNOSIS — J4541 Moderate persistent asthma with (acute) exacerbation: Secondary | ICD-10-CM | POA: Diagnosis not present

## 2022-09-02 DIAGNOSIS — I10 Essential (primary) hypertension: Secondary | ICD-10-CM | POA: Diagnosis not present

## 2022-09-02 DIAGNOSIS — M170 Bilateral primary osteoarthritis of knee: Secondary | ICD-10-CM | POA: Diagnosis not present

## 2022-09-02 DIAGNOSIS — E1140 Type 2 diabetes mellitus with diabetic neuropathy, unspecified: Secondary | ICD-10-CM | POA: Diagnosis not present

## 2022-09-02 DIAGNOSIS — E039 Hypothyroidism, unspecified: Secondary | ICD-10-CM | POA: Diagnosis not present

## 2022-09-02 IMAGING — US US ASPIRATION RIGHT BREAST
1 series · 7 of 7 positions shown · non-contrast
Comparison: Previous exams.

CLINICAL DATA: Right breast cyst aspiration

EXAM:
ULTRASOUND GUIDED RIGHT BREAST CYST ASPIRATION

[Series 1: us aspiration right breast · 0.07mm/px · 7 of 7 slices shown]
[im 1/7]
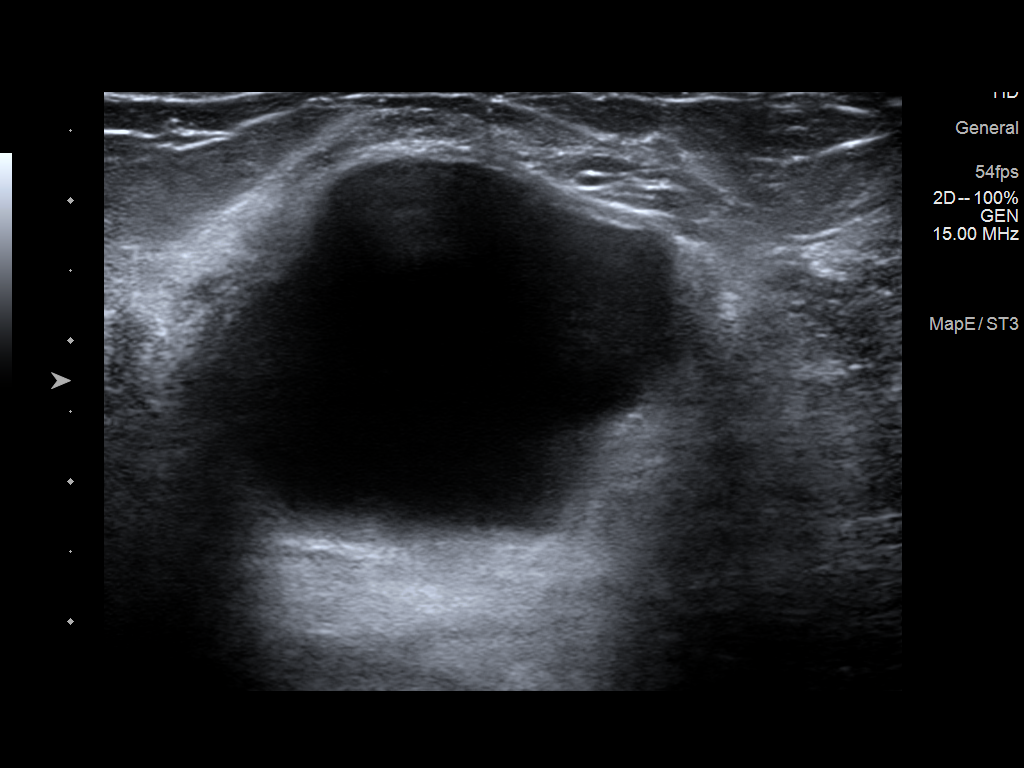
[im 2/7]
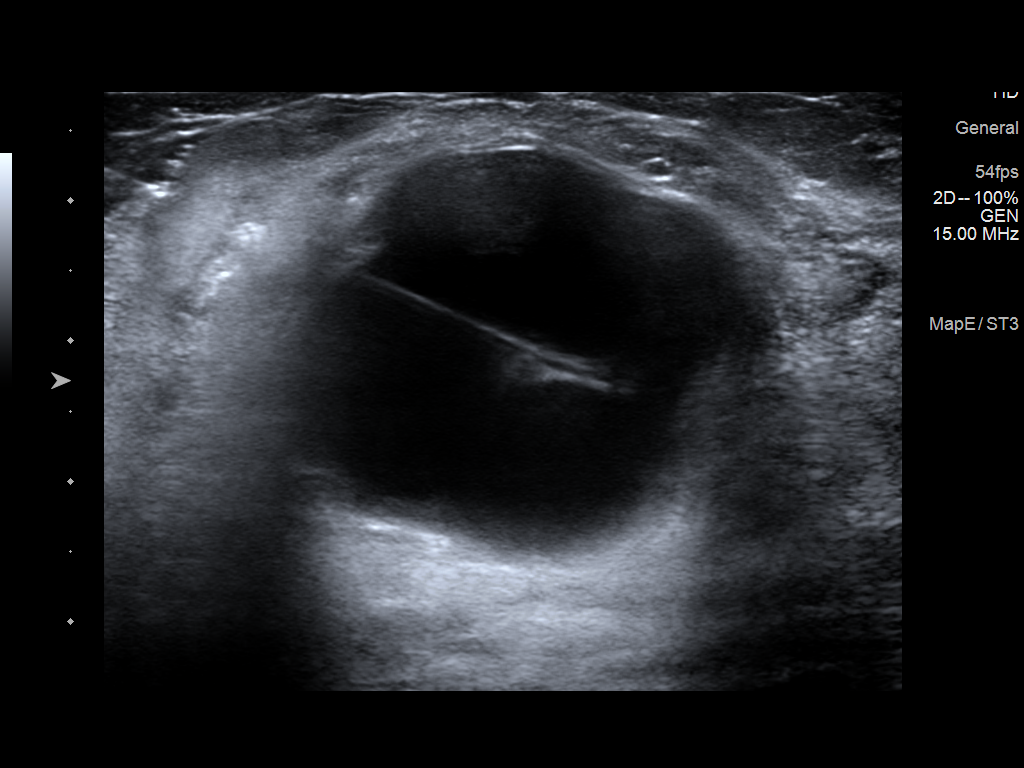
[im 3/7]
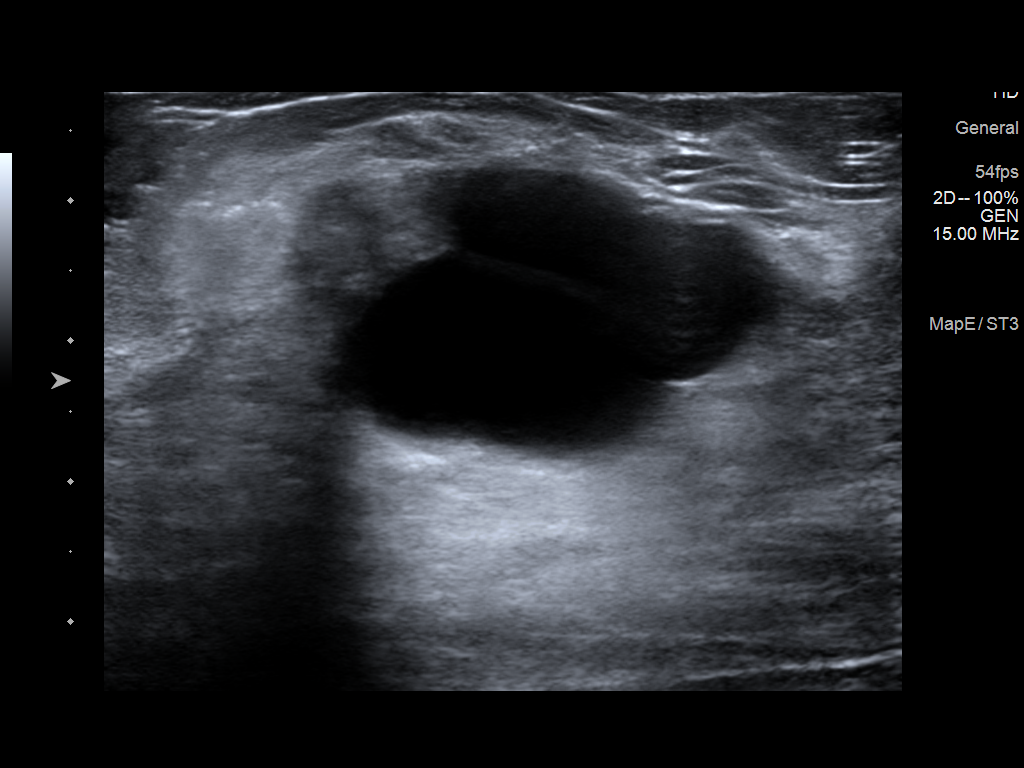
[im 4/7]
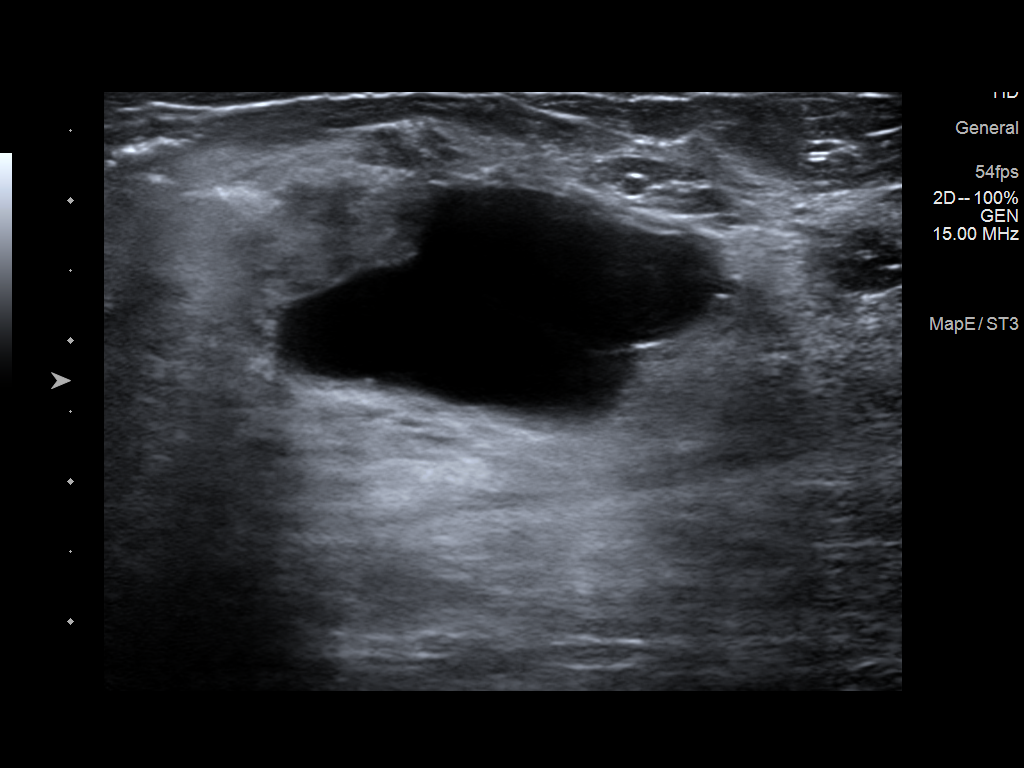
[im 5/7]
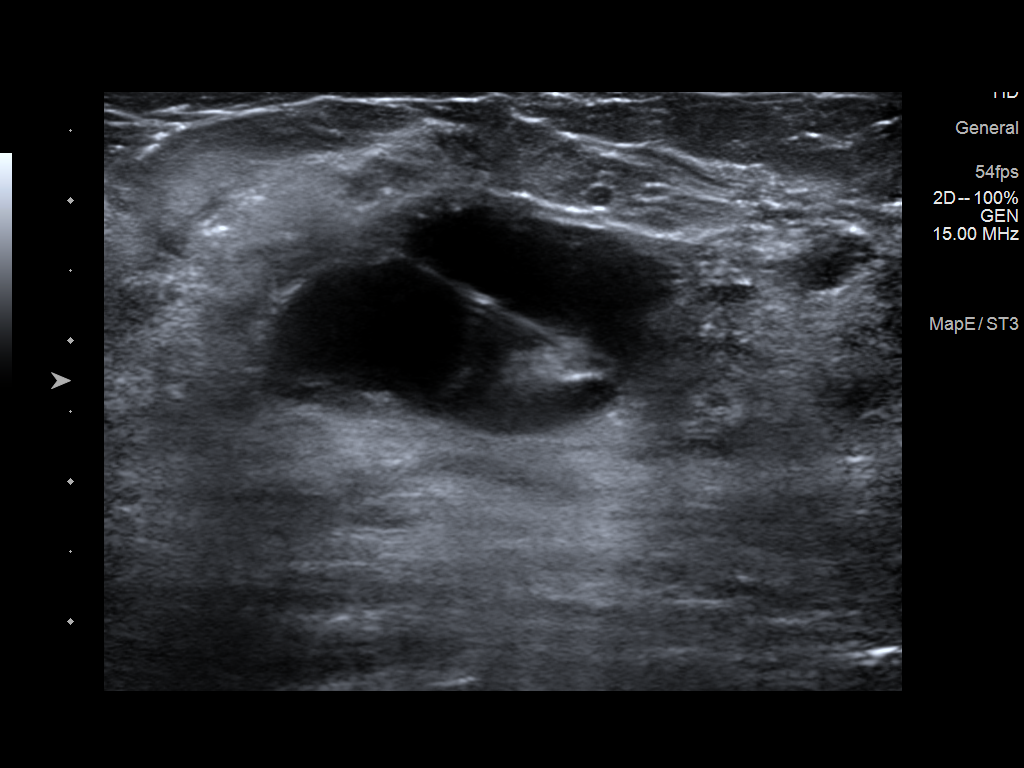
[im 6/7]
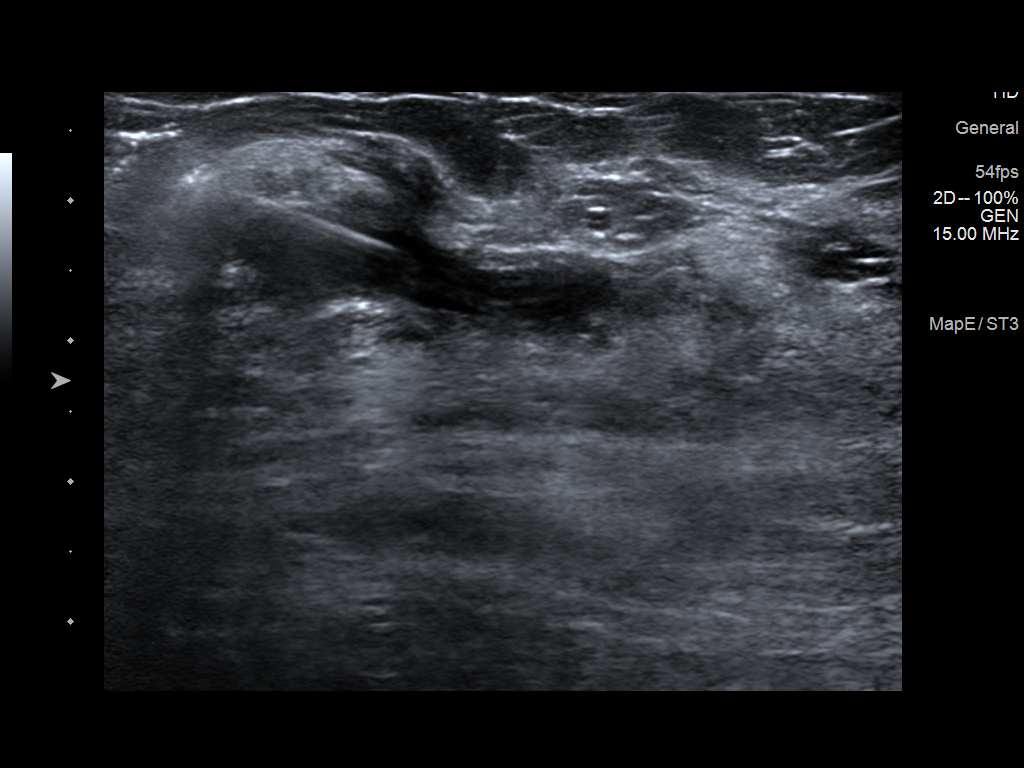
[im 7/7]
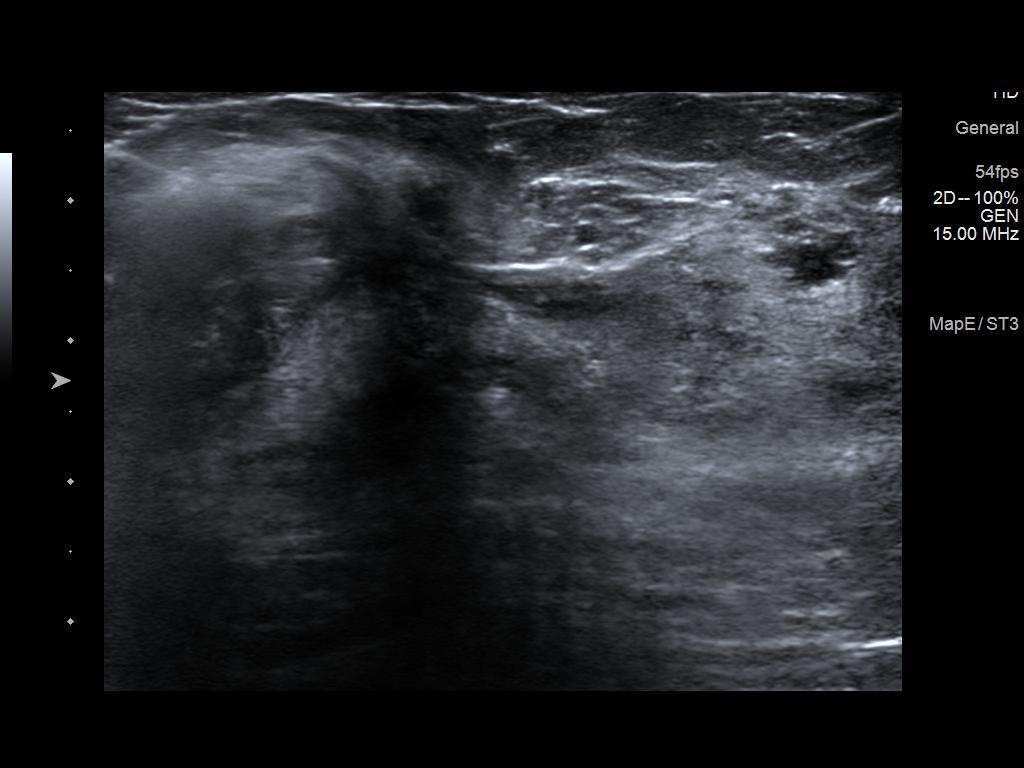

[7 of 7 positions shown; findings below may reference images not displayed]

PROCEDURE:
The patient and I discussed the procedure of ultrasound-guided
aspiration including benefits and alternatives. We discussed the
high likelihood of a successful procedure. We discussed the risks of
the procedure including infection, bleeding, tissue injury, and
inadequate sampling. Informed written consent was given. The usual
time out protocol was performed immediately prior to the procedure.

Using sterile technique, 1% lidocaine, under direct ultrasound
visualization, needle aspiration of a right breast cyst was
performed. 15 cc of yellow fluid was aspirated.
IMPRESSION: Ultrasound-guided aspiration of a right breast cyst no apparent
complications.

RECOMMENDATIONS:
Recommend annual screening mammography.

## 2022-09-04 ENCOUNTER — Encounter: Payer: Self-pay | Admitting: Internal Medicine

## 2022-09-04 DIAGNOSIS — J45909 Unspecified asthma, uncomplicated: Secondary | ICD-10-CM | POA: Insufficient documentation

## 2022-09-04 NOTE — Assessment & Plan Note (Signed)
With weight loss, she finds she is sleeping better without CPAP. Plan-Home sleep test off CPAP

## 2022-09-04 NOTE — Assessment & Plan Note (Signed)
She feels control is good enough she is using rescue inhaler once a day or so.  We discussed options and current status.  Encouraged to walk to maintain endurance.  We can reassess with PFT if needed.  I am not sure her control is as good as she thinks. She does accept a trial of Breztri samples for comparison. Plan-samples of Breztri, CXR

## 2022-09-06 DIAGNOSIS — M961 Postlaminectomy syndrome, not elsewhere classified: Secondary | ICD-10-CM | POA: Diagnosis not present

## 2022-09-06 DIAGNOSIS — M47816 Spondylosis without myelopathy or radiculopathy, lumbar region: Secondary | ICD-10-CM | POA: Diagnosis not present

## 2022-09-06 DIAGNOSIS — G894 Chronic pain syndrome: Secondary | ICD-10-CM | POA: Diagnosis not present

## 2022-09-06 DIAGNOSIS — Z79891 Long term (current) use of opiate analgesic: Secondary | ICD-10-CM | POA: Diagnosis not present

## 2022-09-12 DIAGNOSIS — G43719 Chronic migraine without aura, intractable, without status migrainosus: Secondary | ICD-10-CM | POA: Diagnosis not present

## 2022-09-12 DIAGNOSIS — M791 Myalgia, unspecified site: Secondary | ICD-10-CM | POA: Diagnosis not present

## 2022-09-12 DIAGNOSIS — M542 Cervicalgia: Secondary | ICD-10-CM | POA: Diagnosis not present

## 2022-09-16 DIAGNOSIS — J0101 Acute recurrent maxillary sinusitis: Secondary | ICD-10-CM | POA: Diagnosis not present

## 2022-09-22 NOTE — Progress Notes (Signed)
HPI F never smoker followed for OSA, complicated by HTN, Chronic bilateral Pleural Effusions, Hypokalemia, DM2  HST 01/01/20- AHI 9.8/ hr, desaturation to 82%, body weight 202 lbs HST 08/23/22 (after weight loss) AHI 4.1/ hr, desat to 84%, body weight 162 lbs ==========================================================    08/13/22- 76 yoF never smoker followed for OSA, complicated by HTN, Chronic bilateral Pleural Effusions, Hypokalemia, DM2, Arthritis, GERD, Morbid Obesity, Depression, Asthmatic Bronchitis,  -Albuterol hfa, Symbicort 160, Flonase,  CPAP auto 5-15/ [employer]       AirSense 11/ AutoSet Download-compliance 53%, AHI 5.2/ hr Body weight today-162 bs Covid vax-4 Phizer, J&J Flu vax- had She states she has lost 40 pounds since 2021, crediting attention to diabetes control.  Our records indicate 6 pound weight loss since October.  She now finds she is sleeping better without CPAP discussed importance of weight control for that.  Plan admits some dyspnea on exertion and slight wheeze.  Rescue inhaler helps more than Symbicort, using rescue 1 time daily and aware of pursed lip breathing technique.  We discussed alternatives and will let her try Breztri. She is interested in updating sleep study off CPAP.  09/24/22- 76 yoF never smoker followed for OSA,  Asthmatic Bronchitis, complicated by HTN, Hypokalemia, DM2, Arthritis, GERD, Morbid Obesity, Depression,  -Albuterol hfa,   Flonase,  CPAP auto 5-15/ [employer]       AirSense 11/ AutoSet Download-compliance 80%, AHI 5.2/ hr Body weight today-165 lbs (down 37 lbs from original sleep study) HST 08/23/22 (after weight loss) AHI 4.1/ hr, desat to 84%, body weight 162 lbs We discussed recent sleep study indicating after weight loss she no longer meets definition for OSA and can stop CPAP. Recently treated for sinusitis which has been a recurrent problem. Insurance no longer cover Symbicort.  She dislikes having to rinse her mouth and we discussed options.   No recent significant asthma exacerbation. CXR 08/13/22-  Trachea is midline. Heart size normal. Lungs are clear. No pleural fluid. IMPRESSION: No acute findings.  ROS-see HPI   + = positive Constitutional:    weight loss, night sweats, fevers, chills, fatigue, lassitude. HEENT:    +headaches, difficulty swallowing, +tooth/dental problems, sore throat,       +sneezing, itching, ear ache, +nasal congestion, post nasal drip, snoring CV:    chest pain, orthopnea, PND, swelling in lower extremities, anasarca,                                   dizziness, palpitations Resp:   +shortness of breath with exertion or at rest.                productive cough,   non-productive cough, coughing up of blood.              change in color of mucus.  wheezing.   Skin:    rash or lesions. GI:  No-   heartburn, indigestion, abdominal pain, nausea, vomiting, diarrhea,                 change in bowel habits, loss of appetite GU: dysuria, change in color of urine, no urgency or frequency.   flank pain. MS:  + joint pain, stiffness, decreased range of motion, back pain. Neuro-     nothing unusual Psych:  change in mood or affect.  depression or anxiety.   memory loss.  OBJ- Physical Exam General- Alert, Oriented, Affect-appropriate, Distress- none acute,  Skin- rash-none, lesions- none, excoriation- none    Lymphadenopathy- none Head- atraumatic            Eyes- Gross vision intact, PERRLA, conjunctivae and secretions clear            Ears- Hearing, canals-normal            Nose- Clear, no-Septal dev, mucus, polyps, erosion, perforation             Throat- Mallampati III , mucosa clear , drainage- none, tonsils- atrophic, + teeth Neck- flexible , trachea midline, no stridor , thyroid nl, carotid no bruit Chest - symmetrical excursion , unlabored           Heart/CV- RRR , no murmur , no gallop  , no rub, nl s1 s2                           - JVD- none , edema- none, stasis changes- none, varices- none            Lung- clear to P&A, wheeze- none, cough- none , dullness-none, rub- none           Chest wall-  Abd-  Br/ Gen/ Rectal- Not done, not indicated Extrem- cyanosis- none, clubbing, none, atrophy- none, strength- nl Neuro- grossly intact to observation

## 2022-09-24 ENCOUNTER — Encounter: Payer: Self-pay | Admitting: Internal Medicine

## 2022-09-24 ENCOUNTER — Ambulatory Visit: Payer: Medicare Other | Admitting: Internal Medicine

## 2022-09-24 VITALS — BP 132/84 | HR 94 | Ht 64.0 in | Wt 165.8 lb

## 2022-09-24 DIAGNOSIS — J0191 Acute recurrent sinusitis, unspecified: Secondary | ICD-10-CM

## 2022-09-24 DIAGNOSIS — G4733 Obstructive sleep apnea (adult) (pediatric): Secondary | ICD-10-CM | POA: Diagnosis not present

## 2022-09-24 DIAGNOSIS — J4520 Mild intermittent asthma, uncomplicated: Secondary | ICD-10-CM

## 2022-09-24 DIAGNOSIS — J329 Chronic sinusitis, unspecified: Secondary | ICD-10-CM | POA: Diagnosis not present

## 2022-09-24 MED ORDER — UMECLIDINIUM-VILANTEROL 62.5-25 MCG/ACT IN AEPB: 1.0000 | INHALATION_SPRAY | Freq: Every day | RESPIRATORY_TRACT | 12 refills | Status: DC

## 2022-09-24 NOTE — Patient Instructions (Addendum)
Your most recent sleep study is in the normal range after your weight loss. You can stop using CPAP. Ok to go back to it at any time if snoring and daytime tiredness get worse.  Order- referral to ENT   dx recurrent sinusitis  Script sent for Anoro maintenance controller inhaler to use one inhalation, once daily. Try thi instead of Symbicort  Try an otc nasal saline rinse device like NeilMed

## 2022-10-04 DIAGNOSIS — J4541 Moderate persistent asthma with (acute) exacerbation: Secondary | ICD-10-CM | POA: Diagnosis not present

## 2022-10-04 DIAGNOSIS — Z23 Encounter for immunization: Secondary | ICD-10-CM | POA: Diagnosis not present

## 2022-10-04 DIAGNOSIS — Z0000 Encounter for general adult medical examination without abnormal findings: Secondary | ICD-10-CM | POA: Diagnosis not present

## 2022-10-04 DIAGNOSIS — E1165 Type 2 diabetes mellitus with hyperglycemia: Secondary | ICD-10-CM | POA: Diagnosis not present

## 2022-10-04 DIAGNOSIS — Z79899 Other long term (current) drug therapy: Secondary | ICD-10-CM | POA: Diagnosis not present

## 2022-10-04 DIAGNOSIS — E1140 Type 2 diabetes mellitus with diabetic neuropathy, unspecified: Secondary | ICD-10-CM | POA: Diagnosis not present

## 2022-10-04 DIAGNOSIS — I10 Essential (primary) hypertension: Secondary | ICD-10-CM | POA: Diagnosis not present

## 2022-10-04 DIAGNOSIS — E039 Hypothyroidism, unspecified: Secondary | ICD-10-CM | POA: Diagnosis not present

## 2022-10-08 DIAGNOSIS — M542 Cervicalgia: Secondary | ICD-10-CM | POA: Diagnosis not present

## 2022-10-08 DIAGNOSIS — G43719 Chronic migraine without aura, intractable, without status migrainosus: Secondary | ICD-10-CM | POA: Diagnosis not present

## 2022-10-08 DIAGNOSIS — M791 Myalgia, unspecified site: Secondary | ICD-10-CM | POA: Diagnosis not present

## 2022-10-25 ENCOUNTER — Encounter: Payer: Self-pay | Admitting: Internal Medicine

## 2022-10-25 DIAGNOSIS — J0191 Acute recurrent sinusitis, unspecified: Secondary | ICD-10-CM | POA: Insufficient documentation

## 2022-10-25 NOTE — Assessment & Plan Note (Signed)
We discussed use of nasal saline rinse. Plan-refer to ENT

## 2022-10-25 NOTE — Assessment & Plan Note (Signed)
This problem could be considered an active after weight loss.  She can stop using CPAP.  We will keep a watch against potential weight gain in the future.

## 2022-10-25 NOTE — Assessment & Plan Note (Signed)
Mild intermittent uncomplicated She does not want to have to rinse her mouth after inhalers-discussed Plan-try Anoro for use at least during intervals when a maintenance controller is needed.

## 2022-11-01 DIAGNOSIS — Z79891 Long term (current) use of opiate analgesic: Secondary | ICD-10-CM | POA: Diagnosis not present

## 2022-11-01 DIAGNOSIS — M961 Postlaminectomy syndrome, not elsewhere classified: Secondary | ICD-10-CM | POA: Diagnosis not present

## 2022-11-01 DIAGNOSIS — G894 Chronic pain syndrome: Secondary | ICD-10-CM | POA: Diagnosis not present

## 2022-11-01 DIAGNOSIS — M47816 Spondylosis without myelopathy or radiculopathy, lumbar region: Secondary | ICD-10-CM | POA: Diagnosis not present

## 2022-11-05 DIAGNOSIS — M542 Cervicalgia: Secondary | ICD-10-CM | POA: Diagnosis not present

## 2022-11-05 DIAGNOSIS — M791 Myalgia, unspecified site: Secondary | ICD-10-CM | POA: Diagnosis not present

## 2022-11-05 DIAGNOSIS — G43719 Chronic migraine without aura, intractable, without status migrainosus: Secondary | ICD-10-CM | POA: Diagnosis not present

## 2022-11-07 DIAGNOSIS — N3281 Overactive bladder: Secondary | ICD-10-CM | POA: Diagnosis not present

## 2022-11-18 DIAGNOSIS — G43719 Chronic migraine without aura, intractable, without status migrainosus: Secondary | ICD-10-CM | POA: Diagnosis not present

## 2022-11-18 DIAGNOSIS — M791 Myalgia, unspecified site: Secondary | ICD-10-CM | POA: Diagnosis not present

## 2022-11-18 DIAGNOSIS — M542 Cervicalgia: Secondary | ICD-10-CM | POA: Diagnosis not present

## 2022-12-06 DIAGNOSIS — J329 Chronic sinusitis, unspecified: Secondary | ICD-10-CM | POA: Diagnosis not present

## 2022-12-11 DIAGNOSIS — E785 Hyperlipidemia, unspecified: Secondary | ICD-10-CM | POA: Diagnosis not present

## 2022-12-11 DIAGNOSIS — Z5181 Encounter for therapeutic drug level monitoring: Secondary | ICD-10-CM | POA: Diagnosis not present

## 2022-12-12 DIAGNOSIS — M542 Cervicalgia: Secondary | ICD-10-CM | POA: Diagnosis not present

## 2022-12-12 DIAGNOSIS — G43719 Chronic migraine without aura, intractable, without status migrainosus: Secondary | ICD-10-CM | POA: Diagnosis not present

## 2022-12-12 DIAGNOSIS — M791 Myalgia, unspecified site: Secondary | ICD-10-CM | POA: Diagnosis not present

## 2022-12-31 DIAGNOSIS — M791 Myalgia, unspecified site: Secondary | ICD-10-CM | POA: Diagnosis not present

## 2022-12-31 DIAGNOSIS — G43719 Chronic migraine without aura, intractable, without status migrainosus: Secondary | ICD-10-CM | POA: Diagnosis not present

## 2022-12-31 DIAGNOSIS — M542 Cervicalgia: Secondary | ICD-10-CM | POA: Diagnosis not present

## 2023-01-03 DIAGNOSIS — E1165 Type 2 diabetes mellitus with hyperglycemia: Secondary | ICD-10-CM | POA: Diagnosis not present

## 2023-01-03 DIAGNOSIS — E1140 Type 2 diabetes mellitus with diabetic neuropathy, unspecified: Secondary | ICD-10-CM | POA: Diagnosis not present

## 2023-01-03 DIAGNOSIS — E039 Hypothyroidism, unspecified: Secondary | ICD-10-CM | POA: Diagnosis not present

## 2023-01-03 DIAGNOSIS — I10 Essential (primary) hypertension: Secondary | ICD-10-CM | POA: Diagnosis not present

## 2023-01-14 DIAGNOSIS — J310 Chronic rhinitis: Secondary | ICD-10-CM | POA: Diagnosis not present

## 2023-01-14 DIAGNOSIS — J3489 Other specified disorders of nose and nasal sinuses: Secondary | ICD-10-CM | POA: Diagnosis not present

## 2023-01-14 DIAGNOSIS — J329 Chronic sinusitis, unspecified: Secondary | ICD-10-CM | POA: Diagnosis not present

## 2023-01-15 DIAGNOSIS — G43719 Chronic migraine without aura, intractable, without status migrainosus: Secondary | ICD-10-CM | POA: Diagnosis not present

## 2023-01-15 DIAGNOSIS — M542 Cervicalgia: Secondary | ICD-10-CM | POA: Diagnosis not present

## 2023-01-15 DIAGNOSIS — M791 Myalgia, unspecified site: Secondary | ICD-10-CM | POA: Diagnosis not present

## 2023-01-30 DIAGNOSIS — M791 Myalgia, unspecified site: Secondary | ICD-10-CM | POA: Diagnosis not present

## 2023-01-30 DIAGNOSIS — M542 Cervicalgia: Secondary | ICD-10-CM | POA: Diagnosis not present

## 2023-01-30 DIAGNOSIS — G43719 Chronic migraine without aura, intractable, without status migrainosus: Secondary | ICD-10-CM | POA: Diagnosis not present

## 2023-01-31 DIAGNOSIS — M25562 Pain in left knee: Secondary | ICD-10-CM | POA: Diagnosis not present

## 2023-01-31 DIAGNOSIS — M25561 Pain in right knee: Secondary | ICD-10-CM | POA: Diagnosis not present

## 2023-02-04 DIAGNOSIS — M961 Postlaminectomy syndrome, not elsewhere classified: Secondary | ICD-10-CM | POA: Diagnosis not present

## 2023-02-04 DIAGNOSIS — M47816 Spondylosis without myelopathy or radiculopathy, lumbar region: Secondary | ICD-10-CM | POA: Diagnosis not present

## 2023-02-04 DIAGNOSIS — G894 Chronic pain syndrome: Secondary | ICD-10-CM | POA: Diagnosis not present

## 2023-02-04 DIAGNOSIS — Z79891 Long term (current) use of opiate analgesic: Secondary | ICD-10-CM | POA: Diagnosis not present

## 2023-02-05 DIAGNOSIS — Z79891 Long term (current) use of opiate analgesic: Secondary | ICD-10-CM | POA: Diagnosis not present

## 2023-02-05 DIAGNOSIS — G894 Chronic pain syndrome: Secondary | ICD-10-CM | POA: Diagnosis not present

## 2023-02-10 DIAGNOSIS — G43719 Chronic migraine without aura, intractable, without status migrainosus: Secondary | ICD-10-CM | POA: Diagnosis not present

## 2023-02-10 DIAGNOSIS — M542 Cervicalgia: Secondary | ICD-10-CM | POA: Diagnosis not present

## 2023-02-10 DIAGNOSIS — M791 Myalgia, unspecified site: Secondary | ICD-10-CM | POA: Diagnosis not present

## 2023-02-27 DIAGNOSIS — G43719 Chronic migraine without aura, intractable, without status migrainosus: Secondary | ICD-10-CM | POA: Diagnosis not present

## 2023-02-27 DIAGNOSIS — M542 Cervicalgia: Secondary | ICD-10-CM | POA: Diagnosis not present

## 2023-02-27 DIAGNOSIS — M791 Myalgia, unspecified site: Secondary | ICD-10-CM | POA: Diagnosis not present

## 2023-03-21 DIAGNOSIS — M542 Cervicalgia: Secondary | ICD-10-CM | POA: Diagnosis not present

## 2023-03-21 DIAGNOSIS — G43719 Chronic migraine without aura, intractable, without status migrainosus: Secondary | ICD-10-CM | POA: Diagnosis not present

## 2023-03-21 DIAGNOSIS — M791 Myalgia, unspecified site: Secondary | ICD-10-CM | POA: Diagnosis not present

## 2023-03-26 NOTE — Progress Notes (Deleted)
HPI F never smoker followed for OSA, complicated by HTN, Chronic bilateral Pleural Effusions, Hypokalemia, DM2  HST 01/01/20- AHI 9.8/ hr, desaturation to 82%, body weight 202 lbs HST 08/23/22 (after weight loss) AHI 4.1/ hr, desat to 84%, body weight 162 lbs ==========================================================   09/24/22- 76 yoF never smoker followed for OSA,  Asthmatic Bronchitis, complicated by HTN, Hypokalemia, DM2, Arthritis, GERD, Morbid Obesity, Depression,  -Albuterol hfa,   Flonase,  CPAP auto 5-15/ [employer]       AirSense 11/ AutoSet Download-compliance 80%, AHI 5.2/ hr Body weight today-165 lbs (down 37 lbs from original sleep study) HST 08/23/22 (after weight loss) AHI 4.1/ hr, desat to 84%, body weight 162 lbs We discussed recent sleep study indicating after weight loss she no longer meets definition for OSA and can stop CPAP. Recently treated for sinusitis which has been a recurrent problem. Insurance no longer cover Symbicort.  She dislikes having to rinse her mouth and we discussed options.  No recent significant asthma exacerbation. CXR 08/13/22-  Trachea is midline. Heart size normal. Lungs are clear. No pleural fluid. IMPRESSION: No acute findings.  03/27/23- 76 yoF never smoker followed for OSA,  Asthmatic Bronchitis, Recurrent Sinusitis, complicated by HTN, Hypokalemia, DM2, Arthritis, GERD, Morbid Obesity, Depression, Migraine, Nasal Septal Perforation,  -Albuterol hfa,   Flonase, Anoro, Flonase,  CPAP auto 5-15/ [employer]       AirSense 11/ AutoSet Download-compliance  Body weight today-    ROS-see HPI   + = positive Constitutional:    weight loss, night sweats, fevers, chills, fatigue, lassitude. HEENT:    +headaches, difficulty swallowing, +tooth/dental problems, sore throat,       +sneezing, itching, ear ache, +nasal congestion, post nasal drip, snoring CV:    chest pain, orthopnea, PND, swelling in lower extremities, anasarca,                                    dizziness, palpitations Resp:   +shortness of breath with exertion or at rest.                productive cough,   non-productive cough, coughing up of blood.              change in color of mucus.  wheezing.   Skin:    rash or lesions. GI:  No-   heartburn, indigestion, abdominal pain, nausea, vomiting, diarrhea,                 change in bowel habits, loss of appetite GU: dysuria, change in color of urine, no urgency or frequency.   flank pain. MS:  + joint pain, stiffness, decreased range of motion, back pain. Neuro-     nothing unusual Psych:  change in mood or affect.  depression or anxiety.   memory loss.  OBJ- Physical Exam General- Alert, Oriented, Affect-appropriate, Distress- none acute,  Skin- rash-none, lesions- none, excoriation- none    Lymphadenopathy- none Head- atraumatic            Eyes- Gross vision intact, PERRLA, conjunctivae and secretions clear            Ears- Hearing, canals-normal            Nose- Clear, no-Septal dev, mucus, polyps, erosion, perforation             Throat- Mallampati III , mucosa clear , drainage- none, tonsils- atrophic, + teeth Neck- flexible ,  trachea midline, no stridor , thyroid nl, carotid no bruit Chest - symmetrical excursion , unlabored           Heart/CV- RRR , no murmur , no gallop  , no rub, nl s1 s2                           - JVD- none , edema- none, stasis changes- none, varices- none           Lung- clear to P&A, wheeze- none, cough- none , dullness-none, rub- none           Chest wall-  Abd-  Br/ Gen/ Rectal- Not done, not indicated Extrem- cyanosis- none, clubbing, none, atrophy- none, strength- nl Neuro- grossly intact to observation

## 2023-03-27 ENCOUNTER — Ambulatory Visit: Payer: Medicare Other | Admitting: Internal Medicine

## 2023-04-03 DIAGNOSIS — M961 Postlaminectomy syndrome, not elsewhere classified: Secondary | ICD-10-CM | POA: Diagnosis not present

## 2023-04-03 DIAGNOSIS — Z79891 Long term (current) use of opiate analgesic: Secondary | ICD-10-CM | POA: Diagnosis not present

## 2023-04-03 DIAGNOSIS — G894 Chronic pain syndrome: Secondary | ICD-10-CM | POA: Diagnosis not present

## 2023-04-03 DIAGNOSIS — M47816 Spondylosis without myelopathy or radiculopathy, lumbar region: Secondary | ICD-10-CM | POA: Diagnosis not present

## 2023-04-04 DIAGNOSIS — M542 Cervicalgia: Secondary | ICD-10-CM | POA: Diagnosis not present

## 2023-04-04 DIAGNOSIS — M791 Myalgia, unspecified site: Secondary | ICD-10-CM | POA: Diagnosis not present

## 2023-04-04 DIAGNOSIS — G43719 Chronic migraine without aura, intractable, without status migrainosus: Secondary | ICD-10-CM | POA: Diagnosis not present

## 2023-04-10 DIAGNOSIS — G43009 Migraine without aura, not intractable, without status migrainosus: Secondary | ICD-10-CM | POA: Diagnosis not present

## 2023-04-10 DIAGNOSIS — I10 Essential (primary) hypertension: Secondary | ICD-10-CM | POA: Diagnosis not present

## 2023-04-10 DIAGNOSIS — J4541 Moderate persistent asthma with (acute) exacerbation: Secondary | ICD-10-CM | POA: Diagnosis not present

## 2023-04-10 DIAGNOSIS — E1165 Type 2 diabetes mellitus with hyperglycemia: Secondary | ICD-10-CM | POA: Diagnosis not present

## 2023-04-10 DIAGNOSIS — E039 Hypothyroidism, unspecified: Secondary | ICD-10-CM | POA: Diagnosis not present

## 2023-04-10 DIAGNOSIS — E611 Iron deficiency: Secondary | ICD-10-CM | POA: Diagnosis not present

## 2023-04-10 DIAGNOSIS — E1140 Type 2 diabetes mellitus with diabetic neuropathy, unspecified: Secondary | ICD-10-CM | POA: Diagnosis not present

## 2023-04-17 DIAGNOSIS — N3281 Overactive bladder: Secondary | ICD-10-CM | POA: Diagnosis not present

## 2023-04-25 DIAGNOSIS — M25562 Pain in left knee: Secondary | ICD-10-CM | POA: Diagnosis not present

## 2023-05-01 DIAGNOSIS — M791 Myalgia, unspecified site: Secondary | ICD-10-CM | POA: Diagnosis not present

## 2023-05-01 DIAGNOSIS — G43719 Chronic migraine without aura, intractable, without status migrainosus: Secondary | ICD-10-CM | POA: Diagnosis not present

## 2023-05-01 DIAGNOSIS — M542 Cervicalgia: Secondary | ICD-10-CM | POA: Diagnosis not present

## 2023-05-04 ENCOUNTER — Encounter (HOSPITAL_BASED_OUTPATIENT_CLINIC_OR_DEPARTMENT_OTHER): Payer: Self-pay

## 2023-05-04 ENCOUNTER — Other Ambulatory Visit: Payer: Self-pay

## 2023-05-04 ENCOUNTER — Emergency Department (HOSPITAL_BASED_OUTPATIENT_CLINIC_OR_DEPARTMENT_OTHER): Payer: Medicare Other | Admitting: Radiology

## 2023-05-04 ENCOUNTER — Emergency Department (HOSPITAL_BASED_OUTPATIENT_CLINIC_OR_DEPARTMENT_OTHER)
Admission: EM | Admit: 2023-05-04 | Discharge: 2023-05-04 | Disposition: A | Discharge status: Home / Self Care | Payer: Medicare Other | Attending: Emergency Medicine | Admitting: Emergency Medicine

## 2023-05-04 DIAGNOSIS — J45909 Unspecified asthma, uncomplicated: Secondary | ICD-10-CM | POA: Diagnosis not present

## 2023-05-04 DIAGNOSIS — Z79899 Other long term (current) drug therapy: Secondary | ICD-10-CM | POA: Insufficient documentation

## 2023-05-04 DIAGNOSIS — R42 Dizziness and giddiness: Secondary | ICD-10-CM | POA: Diagnosis present

## 2023-05-04 DIAGNOSIS — R55 Syncope and collapse: Secondary | ICD-10-CM | POA: Diagnosis not present

## 2023-05-04 DIAGNOSIS — I10 Essential (primary) hypertension: Secondary | ICD-10-CM | POA: Diagnosis not present

## 2023-05-04 DIAGNOSIS — Z7951 Long term (current) use of inhaled steroids: Secondary | ICD-10-CM | POA: Insufficient documentation

## 2023-05-04 LAB — BASIC METABOLIC PANEL
Anion gap: 9 (ref 5–15)
BUN: 22 mg/dL (ref 8–23)
CO2: 29 mmol/L (ref 22–32)
Calcium: 9.8 mg/dL (ref 8.9–10.3)
Chloride: 96 mmol/L — ABNORMAL LOW (ref 98–111)
Creatinine, Ser: 1.08 mg/dL — ABNORMAL HIGH (ref 0.44–1.00)
GFR, Estimated: 54 mL/min — ABNORMAL LOW (ref >60)
Glucose, Bld: 155 mg/dL — ABNORMAL HIGH (ref 70–99)
Potassium: 3.8 mmol/L (ref 3.5–5.1)
Sodium: 134 mmol/L — ABNORMAL LOW (ref 135–145)

## 2023-05-04 LAB — CBC
HCT: 48.4 % — ABNORMAL HIGH (ref 36.0–46.0)
Hemoglobin: 16 g/dL — ABNORMAL HIGH (ref 12.0–15.0)
MCH: 26.1 pg (ref 26.0–34.0)
MCHC: 33.1 g/dL (ref 30.0–36.0)
MCV: 79 fL — ABNORMAL LOW (ref 80.0–100.0)
Platelets: 402 10*3/uL — ABNORMAL HIGH (ref 150–400)
RBC: 6.13 MIL/uL — ABNORMAL HIGH (ref 3.87–5.11)
RDW: 13.5 % (ref 11.5–15.5)
WBC: 7.6 10*3/uL (ref 4.0–10.5)
nRBC: 0 % (ref 0.0–0.2)

## 2023-05-04 LAB — TROPONIN I (HIGH SENSITIVITY)
Troponin I (High Sensitivity): 6 ng/L (ref <18)
Troponin I (High Sensitivity): 6 ng/L (ref <18)

## 2023-05-04 LAB — D-DIMER, QUANTITATIVE: D-Dimer, Quant: <0.27 ug{FEU}/mL (ref 0.00–0.50)

## 2023-05-04 LAB — CBG MONITORING, ED: Glucose-Capillary: 189 mg/dL — ABNORMAL HIGH (ref 70–99)

## 2023-05-04 NOTE — ED Notes (Signed)
Pt tolerated PO challenge well.

## 2023-05-04 NOTE — ED Notes (Signed)
Pt tolerated PO challenge well with tomato soup and ginger ale. Pt ambulated down hall with steady gait, pt denies any dizziness, weakness. Pt states feeling better. MD notified.
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
## 2023-05-04 NOTE — Discharge Instructions (Signed)
Your lab work today was reassuring.  I suspect your fainting may have been due to not eating and drinking as much.  Encouraged to drink plenty fluids and follow close with your doctor.  You develop repeat fainting, chest pain, difficulty breathing or any other new concerning symptoms you should return to the ED.

## 2023-05-04 NOTE — ED Notes (Signed)
Patient transported to X-ray 

## 2023-05-04 NOTE — ED Triage Notes (Signed)
Patient arrives via POV with complaints of fainting/falling earlier today.  Patient is concerned that she may have fainted due to witnesses who saw her. Patients son states that he was informed that the patient fell twice at church.

## 2023-05-05 DIAGNOSIS — R55 Syncope and collapse: Secondary | ICD-10-CM | POA: Diagnosis not present

## 2023-05-12 DIAGNOSIS — G43719 Chronic migraine without aura, intractable, without status migrainosus: Secondary | ICD-10-CM | POA: Diagnosis not present

## 2023-05-12 DIAGNOSIS — M791 Myalgia, unspecified site: Secondary | ICD-10-CM | POA: Diagnosis not present

## 2023-05-12 DIAGNOSIS — M542 Cervicalgia: Secondary | ICD-10-CM | POA: Diagnosis not present

## 2023-05-17 DIAGNOSIS — J0140 Acute pansinusitis, unspecified: Secondary | ICD-10-CM | POA: Diagnosis not present

## 2023-05-17 DIAGNOSIS — R519 Headache, unspecified: Secondary | ICD-10-CM | POA: Diagnosis not present

## 2023-05-30 DIAGNOSIS — J329 Chronic sinusitis, unspecified: Secondary | ICD-10-CM | POA: Diagnosis not present

## 2023-05-30 DIAGNOSIS — G44209 Tension-type headache, unspecified, not intractable: Secondary | ICD-10-CM | POA: Diagnosis not present

## 2023-06-03 DIAGNOSIS — M791 Myalgia, unspecified site: Secondary | ICD-10-CM | POA: Diagnosis not present

## 2023-06-03 DIAGNOSIS — G43719 Chronic migraine without aura, intractable, without status migrainosus: Secondary | ICD-10-CM | POA: Diagnosis not present

## 2023-06-03 DIAGNOSIS — M542 Cervicalgia: Secondary | ICD-10-CM | POA: Diagnosis not present

## 2023-06-11 NOTE — Progress Notes (Deleted)
HPI F never smoker followed for OSA, complicated by HTN, Chronic bilateral Pleural Effusions, Hypokalemia, DM2  HST 01/01/20- AHI 9.8/ hr, desaturation to 82%, body weight 202 lbs HST 08/23/22 (after weight loss) AHI 4.1/ hr, desat to 84%, body weight 162 lbs ==========================================================   09/24/22- 76 yoF never smoker followed for OSA,  Asthmatic Bronchitis, complicated by HTN, Hypokalemia, DM2, Arthritis, GERD, Morbid Obesity, Depression,  -Albuterol hfa,   Flonase,  CPAP auto 5-15/ [employer]       AirSense 11/ AutoSet Download-compliance 80%, AHI 5.2/ hr Body weight today-165 lbs (down 37 lbs from original sleep study) HST 08/23/22 (after weight loss) AHI 4.1/ hr, desat to 84%, body weight 162 lbs We discussed recent sleep study indicating after weight loss she no longer meets definition for OSA and can stop CPAP. Recently treated for sinusitis which has been a recurrent problem. Insurance no longer cover Symbicort.  She dislikes having to rinse her mouth and we discussed options.  No recent significant asthma exacerbation. CXR 08/13/22-  Trachea is midline. Heart size normal. Lungs are clear. No pleural fluid. IMPRESSION: No acute findings.  06/12/23- 89yoF never smoker followed for OSA,  Asthmatic Bronchitis, complicated by HTN, Hypokalemia, DM2, Arthritis, GERD, Morbid Obesity, Depression,  -Albuterol hfa,   Flonase,  CPAP auto 5-15/ [employer]       AirSense 11/ AutoSet Download-compliance  Body weight today-  We had referred to [facility]NT for recurrent sinusitis  CXR 07/03/22 IMPRESSION: No active cardiopulmonary disease.   ROS-see HPI   + = positive Constitutional:    weight loss, night sweats, fevers, chills, fatigue, lassitude. HEENT:    +headaches, difficulty swallowing, +tooth/dental problems, sore throat,       +sneezing, itching, ear ache, +nasal congestion, post nasal drip, snoring CV:    chest pain, orthopnea, PND, swelling in lower extremities,  anasarca,                                   dizziness, palpitations Resp:   +shortness of breath with exertion or at rest.                productive cough,   non-productive cough, coughing up of blood.              change in color of mucus.  wheezing.   Skin:    rash or lesions. GI:  No-   heartburn, indigestion, abdominal pain, nausea, vomiting, diarrhea,                 change in bowel habits, loss of appetite GU: dysuria, change in color of urine, no urgency or frequency.   flank pain. MS:  + joint pain, stiffness, decreased range of motion, back pain. Neuro-     nothing unusual Psych:  change in mood or affect.  depression or anxiety.   memory loss.  OBJ- Physical Exam General- Alert, Oriented, Affect-appropriate, Distress- none acute,  Skin- rash-none, lesions- none, excoriation- none    Lymphadenopathy- none Head- atraumatic            Eyes- Gross vision intact, PERRLA, conjunctivae and secretions clear            Ears- Hearing, canals-normal            Nose- Clear, no-Septal dev, mucus, polyps, erosion, perforation             Throat- Mallampati III , mucosa clear , drainage-  none, tonsils- atrophic, + teeth Neck- flexible , trachea midline, no stridor , thyroid nl, carotid no bruit Chest - symmetrical excursion , unlabored           Heart/CV- RRR , no murmur , no gallop  , no rub, nl s1 s2                           - JVD- none , edema- none, stasis changes- none, varices- none           Lung- clear to P&A, wheeze- none, cough- none , dullness-none, rub- none           Chest wall-  Abd-  Br/ Gen/ Rectal- Not done, not indicated Extrem- cyanosis- none, clubbing, none, atrophy- none, strength- nl Neuro- grossly intact to observation

## 2023-06-12 ENCOUNTER — Ambulatory Visit: Payer: Medicare Other | Admitting: Internal Medicine

## 2023-06-19 DIAGNOSIS — G43719 Chronic migraine without aura, intractable, without status migrainosus: Secondary | ICD-10-CM | POA: Diagnosis not present

## 2023-06-19 DIAGNOSIS — M542 Cervicalgia: Secondary | ICD-10-CM | POA: Diagnosis not present

## 2023-06-19 DIAGNOSIS — M791 Myalgia, unspecified site: Secondary | ICD-10-CM | POA: Diagnosis not present

## 2023-07-04 DIAGNOSIS — E559 Vitamin D deficiency, unspecified: Secondary | ICD-10-CM | POA: Diagnosis not present

## 2023-07-04 DIAGNOSIS — Z79891 Long term (current) use of opiate analgesic: Secondary | ICD-10-CM | POA: Diagnosis not present

## 2023-07-04 DIAGNOSIS — G894 Chronic pain syndrome: Secondary | ICD-10-CM | POA: Diagnosis not present

## 2023-07-04 DIAGNOSIS — J329 Chronic sinusitis, unspecified: Secondary | ICD-10-CM | POA: Diagnosis not present

## 2023-07-04 DIAGNOSIS — J4540 Moderate persistent asthma, uncomplicated: Secondary | ICD-10-CM | POA: Diagnosis not present

## 2023-07-04 DIAGNOSIS — E1159 Type 2 diabetes mellitus with other circulatory complications: Secondary | ICD-10-CM | POA: Diagnosis not present

## 2023-07-04 DIAGNOSIS — M961 Postlaminectomy syndrome, not elsewhere classified: Secondary | ICD-10-CM | POA: Diagnosis not present

## 2023-07-04 DIAGNOSIS — I10 Essential (primary) hypertension: Secondary | ICD-10-CM | POA: Diagnosis not present

## 2023-07-04 DIAGNOSIS — E611 Iron deficiency: Secondary | ICD-10-CM | POA: Diagnosis not present

## 2023-07-04 DIAGNOSIS — M47816 Spondylosis without myelopathy or radiculopathy, lumbar region: Secondary | ICD-10-CM | POA: Diagnosis not present

## 2023-07-04 DIAGNOSIS — E1140 Type 2 diabetes mellitus with diabetic neuropathy, unspecified: Secondary | ICD-10-CM | POA: Diagnosis not present

## 2023-07-04 DIAGNOSIS — E1165 Type 2 diabetes mellitus with hyperglycemia: Secondary | ICD-10-CM | POA: Diagnosis not present

## 2023-07-04 DIAGNOSIS — E039 Hypothyroidism, unspecified: Secondary | ICD-10-CM | POA: Diagnosis not present

## 2023-07-04 DIAGNOSIS — G43009 Migraine without aura, not intractable, without status migrainosus: Secondary | ICD-10-CM | POA: Diagnosis not present

## 2023-07-11 DIAGNOSIS — H5203 Hypermetropia, bilateral: Secondary | ICD-10-CM | POA: Diagnosis not present

## 2023-07-11 DIAGNOSIS — H52223 Regular astigmatism, bilateral: Secondary | ICD-10-CM | POA: Diagnosis not present

## 2023-07-11 DIAGNOSIS — E119 Type 2 diabetes mellitus without complications: Secondary | ICD-10-CM | POA: Diagnosis not present

## 2023-07-15 DIAGNOSIS — G43719 Chronic migraine without aura, intractable, without status migrainosus: Secondary | ICD-10-CM | POA: Diagnosis not present

## 2023-07-15 DIAGNOSIS — M542 Cervicalgia: Secondary | ICD-10-CM | POA: Diagnosis not present

## 2023-07-15 DIAGNOSIS — M791 Myalgia, unspecified site: Secondary | ICD-10-CM | POA: Diagnosis not present

## 2023-07-25 DIAGNOSIS — M1712 Unilateral primary osteoarthritis, left knee: Secondary | ICD-10-CM | POA: Diagnosis not present

## 2023-08-08 DIAGNOSIS — Z1231 Encounter for screening mammogram for malignant neoplasm of breast: Secondary | ICD-10-CM | POA: Diagnosis not present

## 2023-08-12 DIAGNOSIS — G43719 Chronic migraine without aura, intractable, without status migrainosus: Secondary | ICD-10-CM | POA: Diagnosis not present

## 2023-08-12 DIAGNOSIS — M791 Myalgia, unspecified site: Secondary | ICD-10-CM | POA: Diagnosis not present

## 2023-08-12 DIAGNOSIS — M542 Cervicalgia: Secondary | ICD-10-CM | POA: Diagnosis not present

## 2023-08-21 DIAGNOSIS — N3289 Other specified disorders of bladder: Secondary | ICD-10-CM | POA: Diagnosis not present

## 2023-08-21 DIAGNOSIS — N3281 Overactive bladder: Secondary | ICD-10-CM | POA: Diagnosis not present

## 2023-08-25 DIAGNOSIS — J019 Acute sinusitis, unspecified: Secondary | ICD-10-CM | POA: Diagnosis not present

## 2023-08-26 DIAGNOSIS — G43719 Chronic migraine without aura, intractable, without status migrainosus: Secondary | ICD-10-CM | POA: Diagnosis not present

## 2023-08-26 DIAGNOSIS — M542 Cervicalgia: Secondary | ICD-10-CM | POA: Diagnosis not present

## 2023-08-26 DIAGNOSIS — M791 Myalgia, unspecified site: Secondary | ICD-10-CM | POA: Diagnosis not present

## 2023-08-31 ENCOUNTER — Emergency Department (HOSPITAL_COMMUNITY)

## 2023-08-31 ENCOUNTER — Other Ambulatory Visit: Payer: Self-pay

## 2023-08-31 ENCOUNTER — Inpatient Hospital Stay (HOSPITAL_COMMUNITY)
Admission: EM | Admit: 2023-08-31 | Discharge: 2023-09-02 | DRG: 195 | Disposition: A | Discharge status: Home / Self Care | Attending: Internal Medicine | Admitting: Internal Medicine

## 2023-08-31 ENCOUNTER — Encounter (HOSPITAL_COMMUNITY): Payer: Self-pay | Admitting: Emergency Medicine

## 2023-08-31 DIAGNOSIS — D751 Secondary polycythemia: Secondary | ICD-10-CM | POA: Diagnosis not present

## 2023-08-31 DIAGNOSIS — I6521 Occlusion and stenosis of right carotid artery: Secondary | ICD-10-CM | POA: Diagnosis not present

## 2023-08-31 DIAGNOSIS — R7303 Prediabetes: Secondary | ICD-10-CM | POA: Diagnosis present

## 2023-08-31 DIAGNOSIS — Z882 Allergy status to sulfonamides status: Secondary | ICD-10-CM | POA: Diagnosis not present

## 2023-08-31 DIAGNOSIS — F419 Anxiety disorder, unspecified: Secondary | ICD-10-CM | POA: Diagnosis not present

## 2023-08-31 DIAGNOSIS — I10 Essential (primary) hypertension: Secondary | ICD-10-CM | POA: Diagnosis not present

## 2023-08-31 DIAGNOSIS — K219 Gastro-esophageal reflux disease without esophagitis: Secondary | ICD-10-CM | POA: Diagnosis not present

## 2023-08-31 DIAGNOSIS — R4702 Dysphasia: Secondary | ICD-10-CM | POA: Diagnosis not present

## 2023-08-31 DIAGNOSIS — E876 Hypokalemia: Secondary | ICD-10-CM | POA: Diagnosis present

## 2023-08-31 DIAGNOSIS — R531 Weakness: Secondary | ICD-10-CM | POA: Diagnosis not present

## 2023-08-31 DIAGNOSIS — E785 Hyperlipidemia, unspecified: Secondary | ICD-10-CM | POA: Diagnosis present

## 2023-08-31 DIAGNOSIS — J102 Influenza due to other identified influenza virus with gastrointestinal manifestations: Secondary | ICD-10-CM | POA: Diagnosis present

## 2023-08-31 DIAGNOSIS — I499 Cardiac arrhythmia, unspecified: Secondary | ICD-10-CM | POA: Diagnosis not present

## 2023-08-31 DIAGNOSIS — G4489 Other headache syndrome: Secondary | ICD-10-CM | POA: Diagnosis not present

## 2023-08-31 DIAGNOSIS — Z881 Allergy status to other antibiotic agents status: Secondary | ICD-10-CM | POA: Diagnosis not present

## 2023-08-31 DIAGNOSIS — R059 Cough, unspecified: Secondary | ICD-10-CM | POA: Diagnosis not present

## 2023-08-31 DIAGNOSIS — E860 Dehydration: Secondary | ICD-10-CM | POA: Diagnosis present

## 2023-08-31 DIAGNOSIS — Z1152 Encounter for screening for COVID-19: Secondary | ICD-10-CM | POA: Diagnosis not present

## 2023-08-31 DIAGNOSIS — Z6827 Body mass index (BMI) 27.0-27.9, adult: Secondary | ICD-10-CM

## 2023-08-31 DIAGNOSIS — E663 Overweight: Secondary | ICD-10-CM | POA: Diagnosis not present

## 2023-08-31 DIAGNOSIS — Z888 Allergy status to other drugs, medicaments and biological substances status: Secondary | ICD-10-CM | POA: Diagnosis not present

## 2023-08-31 DIAGNOSIS — R000 Tachycardia, unspecified: Secondary | ICD-10-CM | POA: Diagnosis not present

## 2023-08-31 DIAGNOSIS — R112 Nausea with vomiting, unspecified: Secondary | ICD-10-CM | POA: Diagnosis not present

## 2023-08-31 DIAGNOSIS — Z7985 Long-term (current) use of injectable non-insulin antidiabetic drugs: Secondary | ICD-10-CM | POA: Diagnosis not present

## 2023-08-31 DIAGNOSIS — R197 Diarrhea, unspecified: Secondary | ICD-10-CM

## 2023-08-31 DIAGNOSIS — J45909 Unspecified asthma, uncomplicated: Secondary | ICD-10-CM | POA: Diagnosis present

## 2023-08-31 DIAGNOSIS — J101 Influenza due to other identified influenza virus with other respiratory manifestations: Secondary | ICD-10-CM | POA: Diagnosis not present

## 2023-08-31 DIAGNOSIS — R0989 Other specified symptoms and signs involving the circulatory and respiratory systems: Secondary | ICD-10-CM | POA: Diagnosis not present

## 2023-08-31 DIAGNOSIS — R231 Pallor: Secondary | ICD-10-CM | POA: Diagnosis not present

## 2023-08-31 DIAGNOSIS — Z885 Allergy status to narcotic agent status: Secondary | ICD-10-CM

## 2023-08-31 DIAGNOSIS — Z79899 Other long term (current) drug therapy: Secondary | ICD-10-CM

## 2023-08-31 DIAGNOSIS — Z886 Allergy status to analgesic agent status: Secondary | ICD-10-CM | POA: Diagnosis not present

## 2023-08-31 DIAGNOSIS — Z90710 Acquired absence of both cervix and uterus: Secondary | ICD-10-CM

## 2023-08-31 DIAGNOSIS — I6782 Cerebral ischemia: Secondary | ICD-10-CM | POA: Diagnosis not present

## 2023-08-31 DIAGNOSIS — R519 Headache, unspecified: Secondary | ICD-10-CM | POA: Diagnosis not present

## 2023-08-31 LAB — BASIC METABOLIC PANEL
Anion gap: 15 (ref 5–15)
BUN: 12 mg/dL (ref 8–23)
CO2: 22 mmol/L (ref 22–32)
Calcium: 9.4 mg/dL (ref 8.9–10.3)
Chloride: 99 mmol/L (ref 98–111)
Creatinine, Ser: 0.89 mg/dL (ref 0.44–1.00)
GFR, Estimated: >60 mL/min (ref >60)
Glucose, Bld: 147 mg/dL — ABNORMAL HIGH (ref 70–99)
Potassium: 3.2 mmol/L — ABNORMAL LOW (ref 3.5–5.1)
Sodium: 136 mmol/L (ref 135–145)

## 2023-08-31 LAB — I-STAT CHEM 8, ED
BUN: 12 mg/dL (ref 8–23)
Calcium, Ion: 1 mmol/L — ABNORMAL LOW (ref 1.15–1.40)
Chloride: 102 mmol/L (ref 98–111)
Creatinine, Ser: 0.8 mg/dL (ref 0.44–1.00)
Glucose, Bld: 142 mg/dL — ABNORMAL HIGH (ref 70–99)
HCT: 51 % — ABNORMAL HIGH (ref 36.0–46.0)
Hemoglobin: 17.3 g/dL — ABNORMAL HIGH (ref 12.0–15.0)
Potassium: 3.2 mmol/L — ABNORMAL LOW (ref 3.5–5.1)
Sodium: 136 mmol/L (ref 135–145)
TCO2: 24 mmol/L (ref 22–32)

## 2023-08-31 LAB — RESP PANEL BY RT-PCR (RSV, FLU A&B, COVID)  RVPGX2
Influenza A by PCR: POSITIVE — AB
Influenza B by PCR: NEGATIVE
Resp Syncytial Virus by PCR: NEGATIVE
SARS Coronavirus 2 by RT PCR: NEGATIVE

## 2023-08-31 LAB — CBC WITH DIFFERENTIAL/PLATELET
Abs Immature Granulocytes: 0.03 10*3/uL (ref 0.00–0.07)
Basophils Absolute: 0 10*3/uL (ref 0.0–0.1)
Basophils Relative: 0 %
Eosinophils Absolute: 0 10*3/uL (ref 0.0–0.5)
Eosinophils Relative: 0 %
HCT: 52.4 % — ABNORMAL HIGH (ref 36.0–46.0)
Hemoglobin: 17 g/dL — ABNORMAL HIGH (ref 12.0–15.0)
Immature Granulocytes: 1 %
Lymphocytes Relative: 17 %
Lymphs Abs: 0.8 10*3/uL (ref 0.7–4.0)
MCH: 26 pg (ref 26.0–34.0)
MCHC: 32.4 g/dL (ref 30.0–36.0)
MCV: 80.1 fL (ref 80.0–100.0)
Monocytes Absolute: 0.6 10*3/uL (ref 0.1–1.0)
Monocytes Relative: 13 %
Neutro Abs: 3.2 10*3/uL (ref 1.7–7.7)
Neutrophils Relative %: 69 %
Platelets: 287 10*3/uL (ref 150–400)
RBC: 6.54 MIL/uL — ABNORMAL HIGH (ref 3.87–5.11)
RDW: 13.2 % (ref 11.5–15.5)
WBC: 4.6 10*3/uL (ref 4.0–10.5)
nRBC: 0 % (ref 0.0–0.2)

## 2023-08-31 LAB — TROPONIN I (HIGH SENSITIVITY): Troponin I (High Sensitivity): 6 ng/L (ref <18)

## 2023-08-31 LAB — C DIFFICILE QUICK SCREEN W PCR REFLEX
C Diff antigen: NEGATIVE
C Diff interpretation: NOT DETECTED
C Diff toxin: NEGATIVE

## 2023-08-31 LAB — PROCALCITONIN: Procalcitonin: 0.17 ng/mL

## 2023-08-31 LAB — GLUCOSE, CAPILLARY: Glucose-Capillary: 128 mg/dL — ABNORMAL HIGH (ref 70–99)

## 2023-08-31 LAB — MAGNESIUM: Magnesium: 2.2 mg/dL (ref 1.7–2.4)

## 2023-08-31 MED ORDER — IOHEXOL 350 MG/ML SOLN
75.0000 mL | Freq: Once | INTRAVENOUS | Status: AC | PRN
  Administered 2023-08-31: 75 mL via INTRAVENOUS

## 2023-08-31 MED ORDER — PRAVASTATIN SODIUM 40 MG PO TABS
40.0000 mg | ORAL_TABLET | Freq: Every day | ORAL | Status: DC
Start: 2023-08-31 — End: 2023-09-02
  Administered 2023-08-31 – 2023-09-02 (×2): 40 mg via ORAL
  Filled 2023-08-31 (×3): qty 1

## 2023-08-31 MED ORDER — ALBUTEROL SULFATE (2.5 MG/3ML) 0.083% IN NEBU: 2.5000 mg | INHALATION_SOLUTION | RESPIRATORY_TRACT | Status: DC | PRN

## 2023-08-31 MED ORDER — ONDANSETRON HCL 4 MG/2ML IJ SOLN
4.0000 mg | Freq: Four times a day (QID) | INTRAMUSCULAR | Status: DC | PRN
  Administered 2023-08-31 – 2023-09-02 (×4): 4 mg via INTRAVENOUS
  Filled 2023-08-31 (×4): qty 2

## 2023-08-31 MED ORDER — METOCLOPRAMIDE HCL 5 MG/ML IJ SOLN
10.0000 mg | Freq: Once | INTRAMUSCULAR | Status: AC
  Administered 2023-08-31: 10 mg via INTRAVENOUS
  Filled 2023-08-31: qty 2

## 2023-08-31 MED ORDER — ESTRADIOL 0.5 MG PO TABS
1.0000 mg | ORAL_TABLET | Freq: Every day | ORAL | Status: DC
  Administered 2023-08-31 – 2023-09-02 (×2): 1 mg via ORAL
  Filled 2023-08-31 (×3): qty 2

## 2023-08-31 MED ORDER — OSELTAMIVIR PHOSPHATE 75 MG PO CAPS
75.0000 mg | ORAL_CAPSULE | Freq: Two times a day (BID) | ORAL | Status: DC
  Administered 2023-08-31 – 2023-09-02 (×4): 75 mg via ORAL
  Filled 2023-08-31 (×5): qty 1

## 2023-08-31 MED ORDER — ACETAMINOPHEN 650 MG RE SUPP: 650.0000 mg | Freq: Four times a day (QID) | RECTAL | Status: DC | PRN

## 2023-08-31 MED ORDER — POTASSIUM CHLORIDE CRYS ER 20 MEQ PO TBCR
40.0000 meq | EXTENDED_RELEASE_TABLET | ORAL | Status: AC
  Administered 2023-08-31: 40 meq via ORAL
  Filled 2023-08-31: qty 2

## 2023-08-31 MED ORDER — PANTOPRAZOLE SODIUM 20 MG PO TBEC
20.0000 mg | DELAYED_RELEASE_TABLET | Freq: Every day | ORAL | Status: DC
  Administered 2023-08-31 – 2023-09-02 (×2): 20 mg via ORAL
  Filled 2023-08-31 (×3): qty 1

## 2023-08-31 MED ORDER — METOCLOPRAMIDE HCL 5 MG PO TABS
5.0000 mg | ORAL_TABLET | Freq: Once | ORAL | Status: AC | PRN
  Administered 2023-08-31: 5 mg via ORAL
  Filled 2023-08-31: qty 1

## 2023-08-31 MED ORDER — BENZONATATE 100 MG PO CAPS
200.0000 mg | ORAL_CAPSULE | Freq: Two times a day (BID) | ORAL | Status: DC | PRN
  Administered 2023-08-31 (×2): 200 mg via ORAL
  Filled 2023-08-31 (×2): qty 2

## 2023-08-31 MED ORDER — SODIUM CHLORIDE 0.9 % IV BOLUS
1000.0000 mL | Freq: Once | INTRAVENOUS | Status: AC
  Administered 2023-08-31: 1000 mL via INTRAVENOUS

## 2023-08-31 MED ORDER — BOOST / RESOURCE BREEZE PO LIQD CUSTOM
1.0000 | Freq: Three times a day (TID) | ORAL | Status: DC
  Administered 2023-09-02: 1 via ORAL

## 2023-08-31 MED ORDER — CITALOPRAM HYDROBROMIDE 20 MG PO TABS
20.0000 mg | ORAL_TABLET | Freq: Every day | ORAL | Status: DC
  Administered 2023-08-31 – 2023-09-02 (×2): 20 mg via ORAL
  Filled 2023-08-31 (×3): qty 1

## 2023-08-31 MED ORDER — ENOXAPARIN SODIUM 40 MG/0.4ML IJ SOSY
40.0000 mg | PREFILLED_SYRINGE | INTRAMUSCULAR | Status: DC
  Administered 2023-08-31 – 2023-09-01 (×2): 40 mg via SUBCUTANEOUS
  Filled 2023-08-31 (×2): qty 0.4

## 2023-08-31 MED ORDER — SODIUM CHLORIDE 0.9% FLUSH
3.0000 mL | Freq: Two times a day (BID) | INTRAVENOUS | Status: DC
  Administered 2023-08-31 – 2023-09-02 (×5): 3 mL via INTRAVENOUS

## 2023-08-31 MED ORDER — GUAIFENESIN ER 600 MG PO TB12
600.0000 mg | ORAL_TABLET | Freq: Two times a day (BID) | ORAL | Status: DC
  Administered 2023-08-31 – 2023-09-02 (×4): 600 mg via ORAL
  Filled 2023-08-31 (×5): qty 1

## 2023-08-31 MED ORDER — SODIUM CHLORIDE 0.9 % IV SOLN: INTRAVENOUS | Status: DC

## 2023-08-31 MED ORDER — OXYCODONE HCL 5 MG PO TABS
5.0000 mg | ORAL_TABLET | ORAL | Status: DC | PRN
  Administered 2023-08-31 – 2023-09-02 (×3): 5 mg via ORAL
  Filled 2023-08-31 (×3): qty 1

## 2023-08-31 MED ORDER — HYDRALAZINE HCL 20 MG/ML IJ SOLN: 10.0000 mg | INTRAMUSCULAR | Status: DC | PRN

## 2023-08-31 MED ORDER — ACETAMINOPHEN 325 MG PO TABS
650.0000 mg | ORAL_TABLET | Freq: Four times a day (QID) | ORAL | Status: DC | PRN
  Administered 2023-08-31 (×2): 650 mg via ORAL
  Filled 2023-08-31 (×2): qty 2

## 2023-08-31 MED ORDER — UBROGEPANT 100 MG PO TABS: 1.0000 | ORAL_TABLET | ORAL | Status: DC | PRN

## 2023-08-31 NOTE — ED Notes (Signed)
 Patient transported to CT

## 2023-08-31 NOTE — ED Notes (Signed)
 CBG 179

## 2023-08-31 NOTE — H&P (Addendum)
 History and Physical    Patient: Melinda Sanford:[id number] DOB: August 08, 1945 DOA: 08/31/2023 DOS: the patient was seen and examined on 08/31/2023 PCP: Emilio Aspen, MD  Patient coming from: Home  Chief Complaint:  Chief Complaint  Patient presents with   Headache   HPI: Melinda Sanford is a 78 y.o. female with medical history significant of hypertension, asthma, prediabetes, anxiety, migraines, and GERD presents  with complaints of cough, headache, and diarrhea.  She started experiencing a terrible productive cough and global headache four days ago. The headache is severe, with her whole face hurting.  Review of records note she had been seen by her primary care doctor on  3/10 and was diagnosed with a sinus infection and started on Augmentin.   She was able to pick up the medicine and started taking it, but reported symptoms progressively worsened.  She also had been seen by neurology and had trigger point treatment for headaches without improvement.  In addition to respiratory symptoms, she started to have severe diarrhea, abdominal pain, nausea, vomiting, and weakness. She had been unable to keep food or drink down, stating that she 'couldn't swallow' and everything would 'come out.'  She has not vomited since two days ago, but has had poor p.o. intake during this time.  She received the flu and COVID vaccinations this season.  .  She normally uses power lift chair to go up the steps in her home,  but does not use a walker or other assistive devices when out.  Due to her symptoms she gotten so weak to the point she was unable to get up and move.  Records note patient reported having significant left-sided weakness related to the ED provider.  Patient does note that her husband being immunocompromised for which she does not want him to come to the hospital as he has leukemia.  In the emergency department patient was noted to be afebrile with pulse elevated up to 114, and all  other vital signs relatively maintained.  Labs significant for WBC 4.6, hemoglobin 17, potassium 3.2, glucose 147, and high-sensitivity troponin 6.  Chest x-ray noted no acute abnormality.  Influenza A screening was positive.  Patient was given 1 L of normal saline IV fluids and Reglan 10 mg IV.  Review of Systems: As mentioned in the history of present illness. All other systems reviewed and are negative. Past Medical History:  Diagnosis Date   Allergic rhinitis    Anxiety    Asthma    Brachial neuritis or radiculitis NOS    DJD (degenerative joint disease)    of the knees, right greater than left - Dr. Hardin Negus, May, 2011   GERD (gastroesophageal reflux disease)    Hypertension    Hyposmolality and/or hyponatremia    Iron deficiency anemia, unspecified    Pain in joint    Pre-diabetes    Past Surgical History:  Procedure Laterality Date   back surgery - Dr. Jeral Fruit     KNEE SURGERY  01/2018   lumbar surgery, Dr. Donalee Citrin, 2012     right knee arthroscopy - Dr. Dion Saucier 2010/2011     TOTAL ABDOMINAL HYSTERECTOMY W/ BILATERAL SALPINGOOPHORECTOMY     Social History:  reports that she has never smoked. She has never used smokeless tobacco. She reports that she does not drink alcohol and does not use drugs.  Allergies  Allergen Reactions   Asa [Aspirin]    Atenolol     headache   Codeine  Doxycycline     Gi upset stomach   Dyazide [Hydrochlorothiazide W-Triamterene]    Erythromycin Base Other (See Comments)    Insomnia   Lactulose Other (See Comments)    headache   Levofloxacin Other (See Comments)    Caused her tongue to burn   Phenergan [Promethazine]    Septra [Sulfamethoxazole-Trimethoprim]     headaches and dizziness   Sulfamethoxazole-Trimethoprim Other (See Comments)    headaches and dizziness   Verapamil     headache   Zolpidem Other (See Comments)    headache    Family History  Problem Relation Age of Onset   Colon cancer Mother    Colon cancer  Father    Breast cancer Maternal Aunt    Breast cancer Paternal Aunt     Prior to Admission medications   Medication Sig Start Date End Date Taking? Authorizing Provider  albuterol (VENTOLIN HFA) 108 (90 Base) MCG/ACT inhaler SMARTSIG:2 Puff(s) By Mouth Every 4 Hours PRN 09/06/19  Yes [provider]  amoxicillin-clavulanate (AUGMENTIN) 875-125 MG tablet Take 1 tablet by mouth 2 (two) times daily. 08/25/23  Yes [provider]  cholecalciferol (VITAMIN D) 1000 UNITS tablet Take 1,000 Units by mouth daily.   Yes [provider]  citalopram (CELEXA) 20 MG tablet Take 20 mg by mouth daily.   Yes [provider]  cyclobenzaprine (FLEXERIL) 10 MG tablet Take 10 mg by mouth 3 (three) times daily as needed for muscle spasms.   Yes [provider]  estradiol (ESTRACE) 1 MG tablet Take 1 mg by mouth daily.    Yes [provider]  ferrous sulfate 325 (65 FE) MG tablet Take by mouth.   Yes [provider]  fluticasone (FLONASE) 50 MCG/ACT nasal spray Place into the nose.   Yes [provider]  hydrochlorothiazide (HYDRODIURIL) 50 MG tablet Take 50 mg by mouth every morning. 03/26/22  Yes [provider]  loratadine (CLARITIN) 10 MG tablet Take 10 mg by mouth daily.   Yes [provider]  oxyCODONE (OXY IR/ROXICODONE) 5 MG immediate release tablet Take 1 tablet by mouth every 4 (four) hours as needed. 10/11/21  Yes [provider]  OZEMPIC, 1 MG/DOSE, 4 MG/3ML SOPN INJECT 1 MG ONCE A WEEK 08/14/23  Yes [provider]  pantoprazole (PROTONIX) 20 MG tablet Take 20 mg by mouth daily.   Yes [provider]  pravastatin (PRAVACHOL) 40 MG tablet Take 40 mg by mouth daily. 10/14/22  Yes [provider]  promethazine (PHENERGAN) 25 MG tablet Take 25 mg by mouth daily as needed for nausea or vomiting.    Yes [provider]  UBRELVY 100 MG TABS TAKE 1 TABLET BY MOUTH AS NEEDED FOR  MIGRAINE. MAY REPEAT ONCE AFTER 2 HOURS 09/28/21  Yes [provider]  spironolactone (ALDACTONE) 25 MG tablet Take 25 mg by mouth daily. 10/22/22   [provider]    Physical Exam: Vitals:   08/31/23 0981 08/31/23 0826 08/31/23 0946 08/31/23 1058  BP: (!) 153/96  139/89   Pulse: (!) 114  (!) 113   Resp: 18  12   Temp:  98.7 F (37.1 C) 98.4 F (36.9 C)   TempSrc: Oral  Oral   SpO2: 100%  100%   Weight:    72.6 kg  Height:    5\' 4"  (1.626 m)    Constitutional: Elderly female who appears acutely ill but able to follow commands Eyes: PERRL, lids and conjunctivae normal ENMT: Mucous membranes  are dry. Neck: normal, supple, no masses, no thyromegaly Respiratory: No significant wheezes or rhonchi appreciated on physical exam. Cardiovascular: Tachycardic. No extremity edema. 2+ pedal pulses.   Abdomen: no tenderness, no masses palpated. No hepatosplenomegaly. Bowel sounds positive.  Musculoskeletal: no clubbing / cyanosis. No joint deformity upper and lower extremities. Good ROM, no contractures. Normal muscle tone.  Skin: no rashes, lesions, ulcers.  Skin tenting present. Neurologic: CN 2-12 grossly intact.  Strength globally 4/5 in all 4 extremities. Psychiatric: Normal judgment and insight. Alert and oriented x 3. Normal mood.   Data Reviewed:  EKG reveals sinus tachycardia 114 bpm.  Reviewed labs, imaging, and pertinent records as documented.  Assessment and Plan:  Influenza A Nausea, vomiting, diarrhea Acute.  Patient presents with complaints of cough, headache, sinus congestion, nausea, vomiting, and diarrhea over the last 4 days.  Influenza A screening was positive.  Chest x-ray showed no acute abnormality.  She had been started on Augmentin on 3/10 for suspected sinusitis.  Nausea and vomiting has improved, but diarrhea reportedly persists.  Symptoms thought to be secondary to influenza A infection, but C. difficile is also on the differential given recent  recent antibiotic use. -Admit to medical telemetry bed -Incentive spirometry and flutter valve -Monitor intake and output -Check procalcitonin -Check C. difficile if diarrhea persists -Diet as tolerated -Tamiflu -Mucinex -Albuterol nebs as needed for shortness of breath/wheezing  Generalized weakness Patient reports having weakness possibly more on the left side for which she was unable to get up for ambulate.  CTA of the head and neck was negative.  Lower suspicion for stroke at this time and suspect symptoms are secondary to dehydration in the setting of acute illness. -PT to eval and treat -Consider need of further workup if symptoms persist after adequate hydration  Hypokalemia Acute.  Initial potassium noted be 3.2.  Magnesium was noted to be within normal limits.   -Give potassium chloride 40 meq p.o.  Essential hypertension Blood pressures noted to be initially maintained. -Hold hydrochlorothiazide -Hydralazine IV as needed for elevated blood pressure  Erythrocytosis Acute.  Hemoglobin noted to be elevated up to 17.  Thought secondary to hemoconcentration in the setting of patient being acutely dehydrated. -IV fluids as noted above  Anxiety -Continue citalopram  GERD -Continue Protonix  Overweight BMI 27.46 kg/m.  Patient is on Ozempic in the outpatient setting  DVT prophylaxis: Lovenox Advance Care Planning:   Code Status: Full Code    Consults: None  Family Communication: Husband updated over the phone  Severity of Illness: The appropriate patient status for this patient is OBSERVATION. Observation status is judged to be reasonable and necessary in order to provide the required intensity of service to ensure the patient's safety. The patient's presenting symptoms, physical exam findings, and initial radiographic and laboratory data in the context of their medical condition is felt to place them at decreased risk for further clinical deterioration. Furthermore,  it is anticipated that the patient will be medically stable for discharge from the hospital within 2 midnights of admission.   Author: Clydie Braun, MD 08/31/2023 11:59 AM  For on call review [email or web address].

## 2023-08-31 NOTE — ED Provider Notes (Signed)
 [place] EMERGENCY DEPARTMENT AT [facility] Provider Note   CSN: [id number] Arrival date & time: 08/31/23  6962     History  Chief Complaint  Patient presents with   Headache    Melinda Sanford is a 78 y.o. female presented to Emergency Department complaint of headache, fatigue, left-sided weakness.  Patient reports both she and her husband have been sick in the house.  She has had a cough and sinus congestion and has been taking Augmentin for the past 4 days prescribed by her PCP.  She is that her headache has been persistent and diffuse.  She developed left-sided arm and leg weakness she feels about 2 or 3 days ago.  She says she has no energy.  She has not been eating or drinking much.  She reports objective fevers and chills at home.  HPI     Home Medications Prior to Admission medications   Medication Sig Start Date End Date Taking? Authorizing Provider  albuterol (VENTOLIN HFA) 108 (90 Base) MCG/ACT inhaler SMARTSIG:2 Puff(s) By Mouth Every 4 Hours PRN 09/06/19  Yes [provider]  amoxicillin-clavulanate (AUGMENTIN) 875-125 MG tablet Take 1 tablet by mouth 2 (two) times daily. 08/25/23  Yes [provider]  cholecalciferol (VITAMIN D) 1000 UNITS tablet Take 1,000 Units by mouth daily.   Yes [provider]  citalopram (CELEXA) 20 MG tablet Take 20 mg by mouth daily.   Yes [provider]  cyclobenzaprine (FLEXERIL) 10 MG tablet Take 10 mg by mouth 3 (three) times daily as needed for muscle spasms.   Yes [provider]  estradiol (ESTRACE) 1 MG tablet Take 1 mg by mouth daily.    Yes [provider]  ferrous sulfate 325 (65 FE) MG tablet Take by mouth.   Yes [provider]  fluticasone (FLONASE) 50 MCG/ACT nasal spray Place into the nose.   Yes [provider]  hydrochlorothiazide (HYDRODIURIL) 50 MG tablet Take 50 mg by mouth every morning. 03/26/22  Yes [provider]  loratadine  (CLARITIN) 10 MG tablet Take 10 mg by mouth daily.   Yes [provider]  oxyCODONE (OXY IR/ROXICODONE) 5 MG immediate release tablet Take 1 tablet by mouth every 4 (four) hours as needed. 10/11/21  Yes [provider]  OZEMPIC, 1 MG/DOSE, 4 MG/3ML SOPN INJECT 1 MG ONCE A WEEK 08/14/23  Yes [provider]  pantoprazole (PROTONIX) 20 MG tablet Take 20 mg by mouth daily.   Yes [provider]  pravastatin (PRAVACHOL) 40 MG tablet Take 40 mg by mouth daily. 10/14/22  Yes [provider]  promethazine (PHENERGAN) 25 MG tablet Take 25 mg by mouth daily as needed for nausea or vomiting.    Yes [provider]  topiramate (TOPAMAX) 50 MG tablet Take 50 mg by mouth daily as needed.   Yes [provider]  UBRELVY 100 MG TABS TAKE 1 TABLET BY MOUTH AS NEEDED FOR MIGRAINE. MAY REPEAT ONCE AFTER 2 HOURS 09/28/21  Yes [provider]  spironolactone (ALDACTONE) 25 MG tablet Take 25 mg by mouth daily. Patient not taking: Reported on 08/31/2023 10/22/22   [provider]      Allergies    Asa [aspirin], Atenolol, Codeine, Doxycycline, Dyazide [hydrochlorothiazide w-triamterene], Erythromycin base, Lactulose, Levofloxacin, Phenergan [promethazine], Septra [sulfamethoxazole-trimethoprim], Sulfamethoxazole-trimethoprim, Verapamil, and Zolpidem    Review of Systems   Review of Systems  Physical Exam Updated Vital Signs BP (!) 161/69   Pulse (!) 105   Temp  98.8 F (37.1 C) (Oral)   Resp 18   Ht 5\' 4"  (1.626 m)   Wt 72.6 kg   SpO2 100%   BMI 27.46 kg/m  Physical Exam Constitutional:      Appearance: She is diaphoretic.  HENT:     Head: Normocephalic and atraumatic.  Eyes:     Conjunctiva/sclera: Conjunctivae normal.     Pupils: Pupils are equal, round, and reactive to light.  Cardiovascular:     Rate and Rhythm: Regular rhythm. Tachycardia present.  Pulmonary:     Effort: Pulmonary effort is normal. No respiratory  distress.  Abdominal:     General: There is no distension.     Tenderness: There is no abdominal tenderness.  Skin:    General: Skin is warm.  Neurological:     General: No focal deficit present.     Mental Status: She is alert. Mental status is at baseline.     GCS: GCS eye subscore is 4. GCS verbal subscore is 5. GCS motor subscore is 6.     Comments: 4-5 strength in the left upper and lower extremity, 5 out of 5 strength in the right upper and lower extremity, patient reports paresthesias in the left half of her body including the face, no evident facial droop, speech is extremely soft, difficult to hear     ED Results / Procedures / Treatments   Labs (all labs ordered are listed, but only abnormal results are displayed) Labs Reviewed  RESP PANEL BY RT-PCR (RSV, FLU A&B, COVID)  RVPGX2 - Abnormal; Notable for the following components:      Result Value   Influenza A by PCR POSITIVE (*)    All other components within normal limits  BASIC METABOLIC PANEL - Abnormal; Notable for the following components:   Potassium 3.2 (*)    Glucose, Bld 147 (*)    All other components within normal limits  CBC WITH DIFFERENTIAL/PLATELET - Abnormal; Notable for the following components:   RBC 6.54 (*)    Hemoglobin 17.0 (*)    HCT 52.4 (*)    All other components within normal limits  I-STAT CHEM 8, ED - Abnormal; Notable for the following components:   Potassium 3.2 (*)    Glucose, Bld 142 (*)    Calcium, Ion 1.00 (*)    Hemoglobin 17.3 (*)    HCT 51.0 (*)    All other components within normal limits  MAGNESIUM  PROCALCITONIN  CBG MONITORING, ED  TROPONIN I (HIGH SENSITIVITY)    EKG EKG Interpretation Date/Time:  Sunday August 31 2023 08:27:04 EDT Ventricular Rate:  114 PR Interval:  151 QRS Duration:  75 QT Interval:  314 QTC Calculation: 433 R Axis:   -19  Text Interpretation: Sinus tachycardia Right atrial enlargement Borderline left axis deviation Confirmed by Alvester Chou [phone]) on 08/31/2023 8:35:13 AM  Radiology CT ANGIO HEAD NECK W WO CM Result Date: 08/31/2023 CLINICAL DATA:  Provided history: Neuro deficit, acute, stroke suspected. Left-sided weakness. EXAM: CT ANGIOGRAPHY HEAD AND NECK WITH AND WITHOUT CONTRAST TECHNIQUE: Multidetector CT imaging of the head and neck was performed using the standard protocol during bolus administration of intravenous contrast. Multiplanar CT image reconstructions and MIPs were obtained to evaluate the vascular anatomy. Carotid stenosis measurements (when applicable) are obtained utilizing NASCET criteria, using the distal internal carotid diameter as the denominator. RADIATION DOSE REDUCTION: This exam was performed according to the departmental dose-optimization program which includes automated exposure control, adjustment of the mA and/or  kV according to patient size and/or use of iterative reconstruction technique. CONTRAST:  75mL OMNIPAQUE IOHEXOL 350 MG/ML SOLN COMPARISON:  Head CT 09/24/2021.  MRI brain and MRA head 01/03/2009. FINDINGS: CT HEAD FINDINGS Brain: Generalized cerebral atrophy. Patchy and ill-defined hypoattenuation within the cerebral white matter, nonspecific but compatible with moderate chronic small vessel ischemic disease. There is no acute intracranial hemorrhage. No demarcated cortical infarct. No extra-axial fluid collection. No evidence of an intracranial mass. No midline shift. Vascular: No hyperdense vessel. Skull: No calvarial fracture or aggressive osseous lesion. Sinuses/Orbits: No orbital mass or acute orbital finding. No significant paranasal sinus disease. Review of the MIP images confirms the above findings CTA NECK FINDINGS Aortic arch: Common origin of the innominate and left common carotid arteries. A sclerotic plaque within the visualized aortic arch. Streak/beam hardening artifact arising from a dense contrast bolus partially obscures the left subclavian artery. Within this limitation, there  is no appreciable hemodynamically significant innominate or proximal subclavian artery stenosis. Right carotid system: CCA and ICA patent within the neck without stenosis. Minimal atherosclerotic plaque within the carotid bulb. Left carotid system: CCA and ICA patent within the neck without stenosis or significant atherosclerotic disease. Vertebral arteries: Patent within the neck without stenosis or significant atherosclerotic disease. The right vertebral artery is dominant. Skeleton: Cervical spondylosis. No acute fracture or aggressive osseous lesion. Other neck: Multiple thyroid nodules measuring up to 10 mm, not meeting consensus criteria for ultrasound follow-up based on size. No follow-up imaging recommended. Reference: J Am Coll Radiol. 2015 Feb;12(2): 143-50. Upper chest: No consolidation within the imaged lung apices. Review of the MIP images confirms the above findings CTA HEAD FINDINGS Anterior circulation: The intracranial internal carotid arteries are patent. The M1 middle cerebral arteries are patent. No M2 proximal branch occlusion or high-grade proximal stenosis. The anterior cerebral arteries are patent. No intracranial aneurysm is identified. Posterior circulation: The intracranial vertebral arteries are patent. The basilar artery is patent. The posterior cerebral arteries are patent. The posterior cerebral arteries are patent. A right posterior communicating artery is present. The left posterior communicating artery is diminutive or absent. Venous sinuses: Within the limitations of contrast timing, no convincing thrombus. Anatomic variants: As described. Review of the MIP images confirms the above findings IMPRESSION: Non-contrast head CT: 1.  No evidence of an acute intracranial abnormality. 2. Parenchymal atrophy and chronic small vessel ischemic disease. CTA neck: 1. The common carotid, internal carotid and vertebral arteries are patent within the neck without stenosis. Minimal atherosclerotic  plaque within the right carotid bulb. 2. Aortic Atherosclerosis (ICD10-I70.0). CTA head: No proximal intracranial large vessel occlusion or high-grade proximal arterial stenosis identified. Electronically Signed   By: Jackey Loge D.O.   On: 08/31/2023 11:13   DG Chest Portable 1 View Result Date: 08/31/2023 CLINICAL DATA:  Cough and congestion.  Headache. EXAM: PORTABLE CHEST 1 VIEW COMPARISON:  None Available. FINDINGS: Bilateral lung fields are clear. Bilateral costophrenic angles are clear. Normal cardio-mediastinal silhouette. No acute osseous abnormalities. The soft tissues are within normal limits. IMPRESSION: No active disease. Electronically Signed   By: Jules Schick M.D.   On: 08/31/2023 09:07    Procedures Procedures    Medications Ordered in ED Medications  enoxaparin (LOVENOX) injection 40 mg (40 mg Subcutaneous Given 08/31/23 1317)  sodium chloride flush (NS) 0.9 % injection 3 mL (3 mLs Intravenous Given 08/31/23 1317)  0.9 %  sodium chloride infusion ( Intravenous New Bag/Given 08/31/23 1315)  acetaminophen (TYLENOL) tablet 650 mg (650 mg Oral  Given 08/31/23 1316)    Or  acetaminophen (TYLENOL) suppository 650 mg ( Rectal See Alternative 08/31/23 1316)  albuterol (PROVENTIL) (2.5 MG/3ML) 0.083% nebulizer solution 2.5 mg (has no administration in time range)  guaiFENesin (MUCINEX) 12 hr tablet 600 mg (600 mg Oral Given 08/31/23 1316)  ondansetron (ZOFRAN) injection 4 mg (has no administration in time range)  benzonatate (TESSALON) capsule 200 mg (has no administration in time range)  hydrALAZINE (APRESOLINE) injection 10 mg (has no administration in time range)  metoCLOPramide (REGLAN) injection 10 mg (10 mg Intravenous Given 08/31/23 0955)  sodium chloride 0.9 % bolus 1,000 mL (0 mLs Intravenous Stopped 08/31/23 1058)  iohexol (OMNIPAQUE) 350 MG/ML injection 75 mL (75 mLs Intravenous Contrast Given 08/31/23 1056)  potassium chloride SA (KLOR-CON M) CR tablet 40 mEq (40 mEq Oral  Given 08/31/23 1316)    ED Course/ Medical Decision Making/ A&P Clinical Course as of 08/31/23 1455  Sun Aug 31, 2023  1109 Influenza A By PCR(!): POSITIVE [MT]  1205 Admitted to Dr Katrinka Blazing hospitalist [MT]    Clinical Course User Index [MT] Terald Sleeper, MD                                 Medical Decision Making Amount and/or Complexity of Data Reviewed Labs: ordered. Decision-making details documented in ED Course. Radiology: ordered.  Risk Prescription drug management. Decision regarding hospitalization.   This patient presents to the ED with concern for generalized weakness, fever, headache, cough. This involves an extensive number of treatment options, and is a complaint that carries with it a high risk of complications and morbidity.  The differential diagnosis includes illness versus CVA versus metabolic derangement versus other  I ordered and personally interpreted labs.  The pertinent results include: Influenza positive.  White blood cell count within normal limits.  I ordered imaging studies including x-ray of the chest, CTA head and neck I independently visualized and interpreted imaging which showed no emergent findings I agree with the radiologist interpretation  The patient was maintained on a cardiac monitor.  I personally viewed and interpreted the cardiac monitored which showed an underlying rhythm of: Sinus rhythm and sinus tachycardia  Per my interpretation the patient's ECG shows no acute ischemic findings  I ordered medication including migraine headache medication, IV fluid  I have reviewed the patients home medicines and have made adjustments as needed  Test Considered: Low suspicion for acute PE   After the interventions noted above, I reevaluated the patient and found that they have: improved   Dispostion:  After consideration of the diagnostic results and the patients response to treatment, I feel that the patent would benefit from medical  admission for suspected acute influenza with poor oral intake, high risk of dehydration and decompensation.  Patient is not hypoxic and stable otherwise at this time.  Low suspicion for acute stroke based on his clinical presentation.  I strongly suspect her paresthesias are more likely related to this infectious etiology, and will defer further neuroimaging decision to inpatient team         Final Clinical Impression(s) / ED Diagnoses Final diagnoses:  Influenza A    Rx / DC Orders ED Discharge Orders     None         Terald Sleeper, MD 08/31/23 1455

## 2023-08-31 NOTE — Plan of Care (Signed)
   Problem: Education: Goal: Knowledge of General Education information will improve Description Including pain rating scale, medication(s)/side effects and non-pharmacologic comfort measures Outcome: Progressing

## 2023-08-31 NOTE — ED Triage Notes (Signed)
 Pt BIB [facility] with reports of headache. Ems reports pts Lkw 1800 yesterday per husband. Pt reports she was last normal 2-3 days ago. Pt has left sided arm and leg weakness. PT oriented x 4 at this time. Pt also reports cough and being treated for sinus infection at this time.

## 2023-09-01 DIAGNOSIS — Z888 Allergy status to other drugs, medicaments and biological substances status: Secondary | ICD-10-CM | POA: Diagnosis not present

## 2023-09-01 DIAGNOSIS — Z79899 Other long term (current) drug therapy: Secondary | ICD-10-CM | POA: Diagnosis not present

## 2023-09-01 DIAGNOSIS — R7303 Prediabetes: Secondary | ICD-10-CM | POA: Diagnosis present

## 2023-09-01 DIAGNOSIS — Z882 Allergy status to sulfonamides status: Secondary | ICD-10-CM | POA: Diagnosis not present

## 2023-09-01 DIAGNOSIS — J101 Influenza due to other identified influenza virus with other respiratory manifestations: Secondary | ICD-10-CM | POA: Diagnosis present

## 2023-09-01 DIAGNOSIS — Z885 Allergy status to narcotic agent status: Secondary | ICD-10-CM | POA: Diagnosis not present

## 2023-09-01 DIAGNOSIS — Z881 Allergy status to other antibiotic agents status: Secondary | ICD-10-CM | POA: Diagnosis not present

## 2023-09-01 DIAGNOSIS — J102 Influenza due to other identified influenza virus with gastrointestinal manifestations: Secondary | ICD-10-CM | POA: Diagnosis present

## 2023-09-01 DIAGNOSIS — E785 Hyperlipidemia, unspecified: Secondary | ICD-10-CM | POA: Diagnosis present

## 2023-09-01 DIAGNOSIS — Z6827 Body mass index (BMI) 27.0-27.9, adult: Secondary | ICD-10-CM | POA: Diagnosis not present

## 2023-09-01 DIAGNOSIS — F419 Anxiety disorder, unspecified: Secondary | ICD-10-CM | POA: Diagnosis present

## 2023-09-01 DIAGNOSIS — E663 Overweight: Secondary | ICD-10-CM | POA: Diagnosis present

## 2023-09-01 DIAGNOSIS — I10 Essential (primary) hypertension: Secondary | ICD-10-CM | POA: Diagnosis present

## 2023-09-01 DIAGNOSIS — Z886 Allergy status to analgesic agent status: Secondary | ICD-10-CM | POA: Diagnosis not present

## 2023-09-01 DIAGNOSIS — Z1152 Encounter for screening for COVID-19: Secondary | ICD-10-CM | POA: Diagnosis not present

## 2023-09-01 DIAGNOSIS — E860 Dehydration: Secondary | ICD-10-CM | POA: Diagnosis present

## 2023-09-01 DIAGNOSIS — D751 Secondary polycythemia: Secondary | ICD-10-CM | POA: Diagnosis present

## 2023-09-01 DIAGNOSIS — Z90710 Acquired absence of both cervix and uterus: Secondary | ICD-10-CM | POA: Diagnosis not present

## 2023-09-01 DIAGNOSIS — Z7985 Long-term (current) use of injectable non-insulin antidiabetic drugs: Secondary | ICD-10-CM | POA: Diagnosis not present

## 2023-09-01 DIAGNOSIS — J45909 Unspecified asthma, uncomplicated: Secondary | ICD-10-CM | POA: Diagnosis present

## 2023-09-01 DIAGNOSIS — E876 Hypokalemia: Secondary | ICD-10-CM | POA: Diagnosis present

## 2023-09-01 DIAGNOSIS — K219 Gastro-esophageal reflux disease without esophagitis: Secondary | ICD-10-CM | POA: Diagnosis present

## 2023-09-01 LAB — GASTROINTESTINAL PANEL BY PCR, STOOL (REPLACES STOOL CULTURE)

## 2023-09-01 LAB — CBC
HCT: 56.4 % — ABNORMAL HIGH (ref 36.0–46.0)
Hemoglobin: 17.8 g/dL — ABNORMAL HIGH (ref 12.0–15.0)
MCH: 25.6 pg — ABNORMAL LOW (ref 26.0–34.0)
MCHC: 31.6 g/dL (ref 30.0–36.0)
MCV: 81.2 fL (ref 80.0–100.0)
Platelets: 297 10*3/uL (ref 150–400)
RBC: 6.95 MIL/uL — ABNORMAL HIGH (ref 3.87–5.11)
RDW: 14.1 % (ref 11.5–15.5)
WBC: 5.4 10*3/uL (ref 4.0–10.5)
nRBC: 0 % (ref 0.0–0.2)

## 2023-09-01 LAB — BASIC METABOLIC PANEL
Anion gap: 14 (ref 5–15)
BUN: 8 mg/dL (ref 8–23)
CO2: 17 mmol/L — ABNORMAL LOW (ref 22–32)
Calcium: 8.8 mg/dL — ABNORMAL LOW (ref 8.9–10.3)
Chloride: 107 mmol/L (ref 98–111)
Creatinine, Ser: 0.75 mg/dL (ref 0.44–1.00)
GFR, Estimated: >60 mL/min (ref >60)
Glucose, Bld: 162 mg/dL — ABNORMAL HIGH (ref 70–99)
Potassium: 3.8 mmol/L (ref 3.5–5.1)
Sodium: 138 mmol/L (ref 135–145)

## 2023-09-01 LAB — GLUCOSE, CAPILLARY
Glucose-Capillary: 179 mg/dL — ABNORMAL HIGH (ref 70–99)
Glucose-Capillary: 135 mg/dL — ABNORMAL HIGH (ref 70–99)

## 2023-09-01 MED ORDER — SENNOSIDES-DOCUSATE SODIUM 8.6-50 MG PO TABS: 1.0000 | ORAL_TABLET | Freq: Every evening | ORAL | Status: DC | PRN

## 2023-09-01 MED ORDER — CALCIUM GLUCONATE-NACL 2-0.675 GM/100ML-% IV SOLN
2.0000 g | Freq: Once | INTRAVENOUS | Status: AC
  Administered 2023-09-01: 2000 mg via INTRAVENOUS
  Filled 2023-09-01: qty 100

## 2023-09-01 MED ORDER — LOPERAMIDE HCL 2 MG PO CAPS
2.0000 mg | ORAL_CAPSULE | ORAL | Status: DC | PRN
  Administered 2023-09-01 (×2): 2 mg via ORAL
  Filled 2023-09-01 (×2): qty 1

## 2023-09-01 MED ORDER — VITAMIN D 25 MCG (1000 UNIT) PO TABS
1000.0000 [IU] | ORAL_TABLET | Freq: Every day | ORAL | Status: DC
  Administered 2023-09-02: 1000 [IU] via ORAL
  Filled 2023-09-01 (×2): qty 1

## 2023-09-01 MED ORDER — LOPERAMIDE HCL 2 MG PO CAPS
4.0000 mg | ORAL_CAPSULE | ORAL | Status: DC | PRN
  Administered 2023-09-02: 4 mg via ORAL
  Filled 2023-09-01: qty 2

## 2023-09-01 MED ORDER — CALCIUM CARBONATE ANTACID 500 MG PO CHEW
1.0000 | CHEWABLE_TABLET | Freq: Once | ORAL | Status: DC | PRN
  Filled 2023-09-01: qty 1

## 2023-09-01 MED ORDER — CYCLOBENZAPRINE HCL 10 MG PO TABS: 10.0000 mg | ORAL_TABLET | Freq: Three times a day (TID) | ORAL | Status: DC | PRN

## 2023-09-01 MED ORDER — IPRATROPIUM-ALBUTEROL 0.5-2.5 (3) MG/3ML IN SOLN: 3.0000 mL | RESPIRATORY_TRACT | Status: DC | PRN

## 2023-09-01 MED ORDER — METOPROLOL TARTRATE 5 MG/5ML IV SOLN: 5.0000 mg | INTRAVENOUS | Status: DC | PRN

## 2023-09-01 MED ORDER — HYDRALAZINE HCL 20 MG/ML IJ SOLN: 10.0000 mg | INTRAMUSCULAR | Status: DC | PRN

## 2023-09-01 NOTE — Progress Notes (Signed)
 Transition of Care [facility]) - Inpatient Brief Assessment   Patient Details  Name: Melinda Sanford MRN: [id number] Date of Birth: May 17, 1946  Transition of Care [facility]) CM/SW Contact:    Janae Bridgeman, RN Phone Number: 09/01/2023, 10:32 AM   Clinical Narrative: CM met with the patient at the bedside.  Moon letter provided.  No TOC needs at this time.   Transition of Care Asessment: Insurance and Status: (P) Insurance coverage has been reviewed Patient has primary care physician: (P) Yes Home environment has been reviewed: (P) from home with spouse Prior level of function:: (P) Independent Prior/Current Home Services: (P) No current home services Social Drivers of Health Review: (P) SDOH reviewed interventions complete Readmission risk has been reviewed: (P) Yes Transition of care needs: (P) no transition of care needs at this time

## 2023-09-01 NOTE — Progress Notes (Signed)
 Mobility Specialist: Progress Note   09/01/23 1628  Mobility  Activity Ambulated with assistance in room  Level of Assistance Contact guard assist, steadying assist  Assistive Device Front wheel walker  Distance Ambulated (ft) 30 ft  Activity Response Tolerated well  Mobility Referral Yes  Mobility visit 1 Mobility  Mobility Specialist Start Time (ACUTE ONLY) 1545  Mobility Specialist Stop Time (ACUTE ONLY) 1554  Mobility Specialist Time Calculation (min) (ACUTE ONLY) 9 min    Pt was agreeable to mobility session - received in bed. C/o weakness and fatigue from not being able to keep food down all day and restlessness. CG for bed mobility, MinG for STS, minG for ambulation with occasional posterior lean. No overt LOB but some unsteadiness. Ambulated to the door and back. Left in bed with all needs met, call bell in reach. Bed alarm on.   Maurene Capes Mobility Specialist Please contact via SecureChat or Rehab office at [phone]

## 2023-09-01 NOTE — Progress Notes (Signed)
 Called [facility] to add GI panel from the stool collected earlier for C.Diff, she will be printing the label and will send specimen to [place].

## 2023-09-01 NOTE — Progress Notes (Signed)
    Durable Medical Equipment  (From admission, onward)           Start     Ordered   09/01/23 1040  For home use only DME Walker rolling  Once       Question Answer Comment  Walker: With 5 Inch Wheels   Patient needs a walker to treat with the following condition Generalized weakness   Patient needs a walker to treat with the following condition Influenza      09/01/23 1039

## 2023-09-01 NOTE — Hospital Course (Addendum)
 Brief Narrative:  78 year old with history of HTN, asthma, prediabetes, anxiety, migraine, GERD comes to the hospital with cough, headaches and diarrhea.  She has been having the symptoms for about a week and was diagnosed with possible sinusitis by PCP and was given Augmentin.  Upon admission noted to have influenza A, generalized weakness and GI symptoms Patient feels great today.  No complaints.  Diarrhea has pretty much resolved, feels stronger.  Will discharge her today on p.o. Tamiflu.  Assessment & Plan:  Principal Problem:   Influenza A Active Problems:   Nausea, vomiting, and diarrhea   Generalized weakness   Hypokalemia   Hypertension   Erythrocytosis   Anxiety   GERD (gastroesophageal reflux disease)   Overweight    Influenza A Nausea, vomiting, diarrhea Acute infection  C. difficile is negative but I wonder if recent antibiotic use is causing some GI symptoms versus fluid cells.  No GI symptoms have resolved.  GI panel is negative.   Generalized weakness Feels much stronger today   Hypokalemia//hypocalcemia As needed repletion   Essential hypertension/hyperlipidemia Resume home regimen   Erythrocytosis Acute.  Hemoglobin noted to be elevated up to 17.     Anxiety -Continue citalopram   GERD -Continue Protonix   Overweight BMI 27.46 kg/m.  Patient is on Ozempic in the outpatient setting     DVT prophylaxis: Lovenox    Code Status: Full Code Family Communication:   Discharge today  Subjective: Feeling well no complaints.  Tells me diarrhea has resolved  Examination:  General exam: Appears calm and comfortable  Respiratory system: Clear to auscultation. Respiratory effort normal. Cardiovascular system: S1 & S2 heard, RRR. No JVD, murmurs, rubs, gallops or clicks. No pedal edema. Gastrointestinal system: Abdomen is nondistended, soft and nontender. No organomegaly or masses felt. Normal bowel sounds heard. Central nervous system: Alert and oriented.  No focal neurological deficits. Extremities: Symmetric 5 x 5 power. Skin: No rashes, lesions or ulcers Psychiatry: Judgement and insight appear normal. Mood & affect appropriate.

## 2023-09-01 NOTE — Plan of Care (Signed)
  Problem: Clinical Measurements: Goal: Will remain free from infection Outcome: Not Progressing   Problem: Clinical Measurements: Goal: Diagnostic test results will improve Outcome: Not Progressing   Problem: Activity: Goal: Risk for activity intolerance will decrease Outcome: Not Progressing   Problem: Nutrition: Goal: Adequate nutrition will be maintained Outcome: Not Progressing   Problem: Elimination: Goal: Will not experience complications related to bowel motility Outcome: Not Progressing   Problem: Pain Managment: Goal: General experience of comfort will improve and/or be controlled Outcome: Not Progressing   Problem: Safety: Goal: Ability to remain free from injury will improve Outcome: Not Progressing

## 2023-09-01 NOTE — Progress Notes (Signed)
 PROGRESS NOTE    Melinda Sanford  ZOX:[id number] DOB: 09/17/1945 DOA: 08/31/2023 PCP: Emilio Aspen, MD    Brief Narrative:  78 year old with history of HTN, asthma, prediabetes, anxiety, migraine, GERD comes to the hospital with cough, headaches and diarrhea.  She has been having the symptoms for about a week and was diagnosed with possible sinusitis by PCP and was given Augmentin.  Upon admission noted to have influenza A, generalized weakness and GI symptoms   Assessment & Plan:  Principal Problem:   Influenza A Active Problems:   Nausea, vomiting, and diarrhea   Generalized weakness   Hypokalemia   Hypertension   Erythrocytosis   Anxiety   GERD (gastroesophageal reflux disease)   Overweight    Influenza A Nausea, vomiting, diarrhea Acute infection  C. difficile is negative but I wonder if recent antibiotic use is causing some GI symptoms versus fluid cells.  GI panel is also been sent.  Supportive care   Generalized weakness This is likely from underlying illness.  PT/OT   Hypokalemia//hypocalcemia As needed repletion   Essential hypertension/hyperlipidemia HCTZ on hold.  IV as needed Pravastatin   Erythrocytosis Acute.  Hemoglobin noted to be elevated up to 17.  Continue IV fluids   Anxiety -Continue citalopram   GERD -Continue Protonix   Overweight BMI 27.46 kg/m.  Patient is on Ozempic in the outpatient setting     DVT prophylaxis: Lovenox    Code Status: Full Code Family Communication:   Continue hospital stay as she still having quite a bit of diarrhea   Subjective: Reports of having about 12 diarrhea episode in the last 24-hour.   Examination:  General exam: Appears calm and comfortable  Respiratory system: Clear to auscultation. Respiratory effort normal. Cardiovascular system: S1 & S2 heard, RRR. No JVD, murmurs, rubs, gallops or clicks. No pedal edema. Gastrointestinal system: Abdomen is nondistended, soft and nontender. No  organomegaly or masses felt. Normal bowel sounds heard. Central nervous system: Alert and oriented. No focal neurological deficits. Extremities: Symmetric 5 x 5 power. Skin: No rashes, lesions or ulcers Psychiatry: Judgement and insight appear normal. Mood & affect appropriate.                Diet Orders (From admission, onward)     Start     Ordered   08/31/23 1217  Diet clear liquid Room service appropriate? Yes; Fluid consistency: Thin  Diet effective now       Question Answer Comment  Room service appropriate? Yes   Fluid consistency: Thin      08/31/23 1217            Objective: Vitals:   08/31/23 1546 08/31/23 1930 09/01/23 0440 09/01/23 0800  BP: (!) 146/86 (!) 163/72 (!) 156/81 (!) 156/90  Pulse: 100 86 (!) 101 99  Resp: 18 18 18 18   Temp: 97.8 F (36.6 C) 97.8 F (36.6 C) 97.9 F (36.6 C) 98.7 F (37.1 C)  TempSrc:  Oral Oral Oral  SpO2: 100% 100% 99% 100%  Weight:      Height:        Intake/Output Summary (Last 24 hours) at 09/01/2023 1246 Last data filed at 09/01/2023 0431 Gross per 24 hour  Intake 1185.94 ml  Output --  Net 1185.94 ml   Filed Weights   08/31/23 1058  Weight: 72.6 kg    Scheduled Meds:  cholecalciferol  1,000 Units Oral Daily   citalopram  20 mg Oral Daily   enoxaparin (LOVENOX) injection  40 mg Subcutaneous Q24H   estradiol  1 mg Oral Daily   feeding supplement  1 Container Oral TID BM   guaiFENesin  600 mg Oral BID   oseltamivir  75 mg Oral BID   pantoprazole  20 mg Oral Daily   pravastatin  40 mg Oral Daily   sodium chloride flush  3 mL Intravenous Q12H   Continuous Infusions:  sodium chloride 75 mL/hr at 09/01/23 0431    Nutritional status     Body mass index is 27.46 kg/m.  Data Reviewed:   CBC: Recent Labs  Lab 08/31/23 0927 08/31/23 0957 09/01/23 0609  WBC 4.6  --  5.4  NEUTROABS 3.2  --   --   HGB 17.0* 17.3* 17.8*  HCT 52.4* 51.0* 56.4*  MCV 80.1  --  81.2  PLT 287  --  297   Basic  Metabolic Panel: Recent Labs  Lab 08/31/23 0927 08/31/23 0957 09/01/23 0609  NA 136 136 138  K 3.2* 3.2* 3.8  CL 99 102 107  CO2 22  --  17*  GLUCOSE 147* 142* 162*  BUN 12 12 8   CREATININE 0.89 0.80 0.75  CALCIUM 9.4  --  8.8*  MG 2.2  --   --    GFR: Estimated Creatinine Clearance: 59.4 mL/min (by C-G formula based on SCr of 0.75 mg/dL). Liver Function Tests: No results for input(s): "AST", "ALT", "ALKPHOS", "BILITOT", "PROT", "ALBUMIN" in the last 168 hours. No results for input(s): "LIPASE", "AMYLASE" in the last 168 hours. No results for input(s): "AMMONIA" in the last 168 hours. Coagulation Profile: No results for input(s): "INR", "PROTIME" in the last 168 hours. Cardiac Enzymes: No results for input(s): "CKTOTAL", "CKMB", "CKMBINDEX", "TROPONINI" in the last 168 hours. BNP (last 3 results) No results for input(s): "PROBNP" in the last 8760 hours. HbA1C: No results for input(s): "HGBA1C" in the last 72 hours. CBG: Recent Labs  Lab 08/31/23 0817 08/31/23 1545 08/31/23 1932  GLUCAP 179* 135* 128*   Lipid Profile: No results for input(s): "CHOL", "HDL", "LDLCALC", "TRIG", "CHOLHDL", "LDLDIRECT" in the last 72 hours. Thyroid Function Tests: No results for input(s): "TSH", "T4TOTAL", "FREET4", "T3FREE", "THYROIDAB" in the last 72 hours. Anemia Panel: No results for input(s): "VITAMINB12", "FOLATE", "FERRITIN", "TIBC", "IRON", "RETICCTPCT" in the last 72 hours. Sepsis Labs: Recent Labs  Lab 08/31/23 0927  PROCALCITON 0.17    Recent Results (from the past 240 hours)  Resp panel by RT-PCR (RSV, Flu A&B, Covid) Anterior Nasal Swab     Status: Abnormal   Collection Time: 08/31/23  9:27 AM   Specimen: Anterior Nasal Swab  Result Value Ref Range Status   SARS Coronavirus 2 by RT PCR NEGATIVE NEGATIVE Final   Influenza A by PCR POSITIVE (A) NEGATIVE Final   Influenza B by PCR NEGATIVE NEGATIVE Final    Comment: (NOTE) The Xpert Xpress SARS-CoV-2/FLU/RSV plus assay  is intended as an aid in the diagnosis of influenza from Nasopharyngeal swab specimens and should not be used as a sole basis for treatment. Nasal washings and aspirates are unacceptable for Xpert Xpress SARS-CoV-2/FLU/RSV testing.  Fact Sheet for Patients: BloggerCourse.com  Fact Sheet for Healthcare Providers: SeriousBroker.it  This test is not yet approved or cleared by the [place] FDA and has been authorized for detection and/or diagnosis of SARS-CoV-2 by FDA under an Emergency Use Authorization (EUA). This EUA will remain in effect (meaning this test can be used) for the duration of the COVID-19 declaration under Section 564(b)(1) of the  Act, 21 U.S.C. section 360bbb-3(b)(1), unless the authorization is terminated or revoked.     Resp Syncytial Virus by PCR NEGATIVE NEGATIVE Final    Comment: (NOTE) Fact Sheet for Patients: BloggerCourse.com  Fact Sheet for Healthcare Providers: SeriousBroker.it  This test is not yet approved or cleared by the [place] FDA and has been authorized for detection and/or diagnosis of SARS-CoV-2 by FDA under an Emergency Use Authorization (EUA). This EUA will remain in effect (meaning this test can be used) for the duration of the COVID-19 declaration under Section 564(b)(1) of the Act, 21 U.S.C. section 360bbb-3(b)(1), unless the authorization is terminated or revoked.  Performed at [facility], [address] [place], [place] [address]   C Difficile Quick Screen w PCR reflex     Status: None   Collection Time: 08/31/23  8:43 PM   Specimen: STOOL  Result Value Ref Range Status   C Diff antigen NEGATIVE NEGATIVE Final   C Diff toxin NEGATIVE NEGATIVE Final   C Diff interpretation No C. difficile detected.  Final    Comment: Performed at [facility], [address], [place], [place] [address]          Radiology Studies: CT ANGIO HEAD NECK W WO CM Result Date: 08/31/2023 CLINICAL DATA:  Provided history: Neuro deficit, acute, stroke suspected. Left-sided weakness. EXAM: CT ANGIOGRAPHY HEAD AND NECK WITH AND WITHOUT CONTRAST TECHNIQUE: Multidetector CT imaging of the head and neck was performed using the standard protocol during bolus administration of intravenous contrast. Multiplanar CT image reconstructions and MIPs were obtained to evaluate the vascular anatomy. Carotid stenosis measurements (when applicable) are obtained utilizing NASCET criteria, using the distal internal carotid diameter as the denominator. RADIATION DOSE REDUCTION: This exam was performed according to the departmental dose-optimization program which includes automated exposure control, adjustment of the mA and/or kV according to patient size and/or use of iterative reconstruction technique. CONTRAST:  75mL OMNIPAQUE IOHEXOL 350 MG/ML SOLN COMPARISON:  Head CT 09/24/2021.  MRI brain and MRA head 01/03/2009. FINDINGS: CT HEAD FINDINGS Brain: Generalized cerebral atrophy. Patchy and ill-defined hypoattenuation within the cerebral white matter, nonspecific but compatible with moderate chronic small vessel ischemic disease. There is no acute intracranial hemorrhage. No demarcated cortical infarct. No extra-axial fluid collection. No evidence of an intracranial mass. No midline shift. Vascular: No hyperdense vessel. Skull: No calvarial fracture or aggressive osseous lesion. Sinuses/Orbits: No orbital mass or acute orbital finding. No significant paranasal sinus disease. Review of the MIP images confirms the above findings CTA NECK FINDINGS Aortic arch: Common origin of the innominate and left common carotid arteries. A sclerotic plaque within the visualized aortic arch. Streak/beam hardening artifact arising from a dense contrast bolus partially obscures the left subclavian artery. Within this limitation, there is no appreciable  hemodynamically significant innominate or proximal subclavian artery stenosis. Right carotid system: CCA and ICA patent within the neck without stenosis. Minimal atherosclerotic plaque within the carotid bulb. Left carotid system: CCA and ICA patent within the neck without stenosis or significant atherosclerotic disease. Vertebral arteries: Patent within the neck without stenosis or significant atherosclerotic disease. The right vertebral artery is dominant. Skeleton: Cervical spondylosis. No acute fracture or aggressive osseous lesion. Other neck: Multiple thyroid nodules measuring up to 10 mm, not meeting consensus criteria for ultrasound follow-up based on size. No follow-up imaging recommended. Reference: J Am Coll Radiol. 2015 Feb;12(2): 143-50. Upper chest: No consolidation within the imaged lung apices. Review of the MIP images confirms the above  findings CTA HEAD FINDINGS Anterior circulation: The intracranial internal carotid arteries are patent. The M1 middle cerebral arteries are patent. No M2 proximal branch occlusion or high-grade proximal stenosis. The anterior cerebral arteries are patent. No intracranial aneurysm is identified. Posterior circulation: The intracranial vertebral arteries are patent. The basilar artery is patent. The posterior cerebral arteries are patent. The posterior cerebral arteries are patent. A right posterior communicating artery is present. The left posterior communicating artery is diminutive or absent. Venous sinuses: Within the limitations of contrast timing, no convincing thrombus. Anatomic variants: As described. Review of the MIP images confirms the above findings IMPRESSION: Non-contrast head CT: 1.  No evidence of an acute intracranial abnormality. 2. Parenchymal atrophy and chronic small vessel ischemic disease. CTA neck: 1. The common carotid, internal carotid and vertebral arteries are patent within the neck without stenosis. Minimal atherosclerotic plaque within the  right carotid bulb. 2. Aortic Atherosclerosis (ICD10-I70.0). CTA head: No proximal intracranial large vessel occlusion or high-grade proximal arterial stenosis identified. Electronically Signed   By: Jackey Loge D.O.   On: 08/31/2023 11:13   DG Chest Portable 1 View Result Date: 08/31/2023 CLINICAL DATA:  Cough and congestion.  Headache. EXAM: PORTABLE CHEST 1 VIEW COMPARISON:  None Available. FINDINGS: Bilateral lung fields are clear. Bilateral costophrenic angles are clear. Normal cardio-mediastinal silhouette. No acute osseous abnormalities. The soft tissues are within normal limits. IMPRESSION: No active disease. Electronically Signed   By: Jules Schick M.D.   On: 08/31/2023 09:07           LOS: 0 days   Time spent= 35 mins    Miguel Rota, MD [facility]  If 7PM-7AM, please contact night-coverage  09/01/2023, 12:46 PM

## 2023-09-01 NOTE — Care Management Obs Status (Cosign Needed)
 MEDICARE OBSERVATION STATUS NOTIFICATION   Patient Details  Name: NITZIA PERREN MRN: [id number] Date of Birth: 1945/07/11   Medicare Observation Status Notification Given:  Yes    Janae Bridgeman, RN 09/01/2023, 10:29 AM

## 2023-09-01 NOTE — Evaluation (Signed)
 Physical Therapy Evaluation Patient Details Name: Melinda Sanford MRN: [id number] DOB: June 13, 1946 Today's Date: 09/01/2023  History of Present Illness  Pt is a 78 y.o. female admitted 3/16 with influenza A. PMH:  HTN, asthma, prediabetes, anxiety, migraines, and GERD  Clinical Impression  Pt admitted with above diagnosis. PTA pt lived at home with spouse, independent and driving. Pt currently with functional limitations due to the deficits listed below (see PT Problem List). On eval, she required min assist bed mobility, mod assist sit to stand, min assist SPT, and min assist amb 10' pushing IV pole. She demo good sitting balance and poor standing balance. Generalized weakness noted from acute illness. Amb distance limited by diarrhea, continual urgency for BM. Pt will benefit from acute skilled PT to increase their independence and safety with mobility to allow discharge.  PT to follow acutely. Recommend HHPT upon d/c.          If plan is discharge home, recommend the following: A little help with walking and/or transfers;A little help with bathing/dressing/bathroom;Assist for transportation;Assistance with cooking/housework   Can travel by [employer], [employer] (2 wheels)  Recommendations for Other Services  OT consult    Functional Status Assessment Patient has had a recent decline in their functional status and demonstrates the ability to make significant improvements in function in a reasonable and predictable amount of time.     Precautions / Restrictions Precautions Precautions: Fall      Mobility  Bed Mobility Overal bed mobility: Needs Assistance Bed Mobility: Supine to Sit     Supine to sit: Min assist, HOB elevated, Used rails     General bed mobility comments: increased time, assist with trunk    Transfers Overall transfer level: Needs assistance Equipment used: None Transfers: Sit to/from Stand, Bed to  chair/wheelchair/BSC Sit to Stand: Mod assist   Step pivot transfers: Min assist       General transfer comment: increased time to stabilize initial standing balance, retropulsive. Assist for anterior translation of weight    Ambulation/Gait Ambulation/Gait assistance: Min assist Gait Distance (Feet): 10 Feet Assistive device: IV Pole Gait Pattern/deviations: Step-through pattern, Decreased stride length Gait velocity: decreased Gait velocity interpretation: <1.31 ft/sec, indicative of household ambulator   General Gait Details: distance limited by diarrhea, continual urgency for BM  Stairs            Wheelchair Mobility     Tilt Bed    Modified Rankin (Stroke Patients Only)       Balance Overall balance assessment: Needs assistance Sitting-balance support: No upper extremity supported, Feet supported Sitting balance-Leahy Scale: Good   Postural control: Posterior lean Standing balance support: Single extremity supported, Bilateral upper extremity supported, During functional activity Standing balance-Leahy Scale: Poor                               Pertinent Vitals/Pain Pain Assessment Pain Assessment: No/denies pain    Home Living Family/patient expects to be discharged to:: Private residence Living Arrangements: Spouse/significant other Available Help at Discharge: Family;Available 24 hours/day Type of Home: House Home Access: Level entry (from garage)     Alternate Level Stairs-Number of Steps: stair lift Home Layout: Two level;Bed/bath upstairs;1/2 bath on main level Home Equipment: Other (comment) (stair lift)      Prior Function Prior Level of Function : Independent/Modified Independent;Driving  Mobility Comments: no AD at baseline       Extremity/Trunk Assessment   Upper Extremity Assessment Upper Extremity Assessment: Generalized weakness    Lower Extremity Assessment Lower Extremity Assessment:  Generalized weakness    Cervical / Trunk Assessment Cervical / Trunk Assessment: Normal  Communication   Communication Communication: Impaired Factors Affecting Communication: Reduced clarity of speech (soft spoken, difficult to understand at times)    Cognition Arousal: Alert Behavior During Therapy: WFL for tasks assessed/performed   PT - Cognitive impairments: Safety/Judgement, Problem solving                         Following commands: Intact       Cueing Cueing Techniques: Verbal cues     General Comments General comments (skin integrity, edema, etc.): HR into 110s during activity    Exercises     Assessment/Plan    PT Assessment Patient needs continued PT services  PT Problem List Decreased strength;Decreased balance;Decreased knowledge of use of DME;Decreased mobility;Decreased activity tolerance;Decreased safety awareness       PT Treatment Interventions DME instruction;Functional mobility training;Balance training;Patient/family education;Gait training;Therapeutic activities;Therapeutic exercise    PT Goals (Current goals can be found in the Care Plan section)  Acute Rehab PT Goals Patient Stated Goal: home PT Goal Formulation: With patient Time For Goal Achievement: 09/15/23 Potential to Achieve Goals: Good    Frequency Min 2X/week     Co-evaluation               AM-PAC PT "6 Clicks" Mobility  Outcome Measure Help needed turning from your back to your side while in a flat bed without using bedrails?: A Little Help needed moving from lying on your back to sitting on the side of a flat bed without using bedrails?: A Little Help needed moving to and from a bed to a chair (including a wheelchair)?: A Little Help needed standing up from a chair using your arms (e.g., wheelchair or bedside chair)?: A Lot Help needed to walk in hospital room?: A Little Help needed climbing 3-5 steps with a railing? : Total 6 Click Score: 15    End of  Session Equipment Utilized During Treatment: Gait belt Activity Tolerance: Patient tolerated treatment well Patient left: in chair;with call bell/phone within reach;with family/visitor present;with chair alarm set Nurse Communication: Mobility status PT Visit Diagnosis: Muscle weakness (generalized) (M62.81);Difficulty in walking, not elsewhere classified (R26.2)    Time: 4098-1191 PT Time Calculation (min) (ACUTE ONLY): 24 min   Charges:   PT Evaluation $PT Eval Moderate Complexity: 1 Mod   PT General Charges $$ ACUTE PT VISIT: 1 Visit         Ferd Glassing., PT  Office # [phone]   Ilda Foil 09/01/2023, 9:12 AM

## 2023-09-01 NOTE — Evaluation (Signed)
 Occupational Therapy Evaluation Patient Details Name: Melinda Sanford MRN: [id number] DOB: 24-Jan-1946 Today's Date: 09/01/2023   History of Present Illness   Pt is a 78 y.o. female admitted 3/16 with influenza A. PMH:  HTN, asthma, prediabetes, anxiety, migraines, and GERD     Clinical Impressions Pt reports ind at baseline with ADLs/functional mobility, lives with spouse who can assist at d/c. Pt currently reports incr weakness, needs up to mod A for ADLs, min A for bed mobility and min A for transfers with RW. Pt with posterior lean with initial standing and slow steps. Pt urgently needing to go to [facility], however unsuccessful void. Pt presenting with impairments listed below, will follow acutely. Recommend HHOT at d/c.      If plan is discharge home, recommend the following:   A little help with walking and/or transfers;A lot of help with bathing/dressing/bathroom;Assistance with cooking/housework;Direct supervision/assist for financial management;Direct supervision/assist for medications management;Assist for transportation;Help with stairs or ramp for entrance     Functional Status Assessment   Patient has had a recent decline in their functional status and demonstrates the ability to make significant improvements in function in a reasonable and predictable amount of time.     Equipment Recommendations   BSC/3in1;Other (comment) (RW)     Recommendations for Other Services   PT consult     Precautions/Restrictions   Precautions Precautions: Fall Restrictions Weight Bearing Restrictions Per Provider Order: No     Mobility Bed Mobility Overal bed mobility: Needs Assistance Bed Mobility: Supine to Sit, Sit to Supine     Supine to sit: Min assist Sit to supine: Min assist        Transfers Overall transfer level: Needs assistance Equipment used: Rolling walker (2 wheels) Transfers: Sit to/from Stand, Bed to chair/wheelchair/BSC Sit to Stand: Min assist                   Balance Overall balance assessment: Needs assistance Sitting-balance support: No upper extremity supported, Feet supported Sitting balance-Leahy Scale: Good   Postural control: Posterior lean Standing balance support: During functional activity, Reliant on assistive device for balance Standing balance-Leahy Scale: Poor Standing balance comment: posterior lean in standing                           ADL either performed or assessed with clinical judgement   ADL Overall ADL's : Needs assistance/impaired Eating/Feeding: Set up;Sitting   Grooming: Minimal assistance;Sitting   Upper Body Bathing: Moderate assistance;Sitting   Lower Body Bathing: Moderate assistance;Sitting/lateral leans   Upper Body Dressing : Minimal assistance;Sitting   Lower Body Dressing: Minimal assistance;Sitting/lateral leans;Sit to/from stand   Toilet Transfer: Minimal assistance;Ambulation;Rolling walker (2 wheels)   Toileting- Clothing Manipulation and Hygiene: Minimal assistance       Functional mobility during ADLs: Minimal assistance;Rolling walker (2 wheels)       Vision   Vision Assessment?: No apparent visual deficits     Perception Perception: Not tested       Praxis Praxis: Not tested       Pertinent Vitals/Pain Pain Assessment Pain Assessment: Faces Pain Score: 2  Faces Pain Scale: Hurts a little bit Pain Location: global weakness Pain Descriptors / Indicators: Discomfort Pain Intervention(s): Limited activity within patient's tolerance, Monitored during session, Repositioned     Extremity/Trunk Assessment Upper Extremity Assessment Upper Extremity Assessment: Generalized weakness (L   > R weakness ;pt states is intermittent/chronic?)   Lower Extremity Assessment Lower Extremity  Assessment: Defer to PT evaluation   Cervical / Trunk Assessment Cervical / Trunk Assessment: Normal   Communication Communication Communication: Impaired    Cognition Arousal: Alert Behavior During Therapy: WFL for tasks assessed/performed Cognition: No apparent impairments             OT - Cognition Comments: some decr safety awarness, asking if she can get up independently but needs min A and feels "a lot weaker" than baseline                 Following commands: Intact       Cueing  General Comments   Cueing Techniques: Verbal cues  VSS on RA   Exercises     Shoulder Instructions      Home Living Family/patient expects to be discharged to:: Private residence Living Arrangements: Spouse/significant other Available Help at Discharge: Family;Available 24 hours/day Type of Home: House Home Access: Level entry     Home Layout: Two level;Bed/bath upstairs;1/2 bath on main level Alternate Level Stairs-Number of Steps: stair lift   Bathroom Shower/Tub: Producer, television/film/video: Standard     Home Equipment: Other (comment) (stair lift)          Prior Functioning/Environment Prior Level of Function : Independent/Modified Independent;Driving             Mobility Comments: no AD at baseline ADLs Comments: ind    OT Problem List: Decreased strength;Decreased range of motion;Decreased activity tolerance;Impaired balance (sitting and/or standing);Decreased safety awareness;Decreased cognition   OT Treatment/Interventions: Self-care/ADL training;Therapeutic exercise;Energy conservation;DME and/or AE instruction;Therapeutic activities;Balance training;Patient/family education      OT Goals(Current goals can be found in the care plan section)   Acute Rehab OT Goals Patient Stated Goal: none stated OT Goal Formulation: With patient Time For Goal Achievement: 09/15/23 Potential to Achieve Goals: Good ADL Goals Pt Will Perform Upper Body Dressing: Independently;sitting Pt Will Perform Lower Body Dressing: Independently;sitting/lateral leans;sit to/from stand Pt Will Transfer to Toilet:  Independently;ambulating;regular height toilet Pt Will Perform Tub/Shower Transfer: Tub transfer;Shower transfer;Independently;ambulating   OT Frequency:  Min 2X/week    Co-evaluation              AM-PAC OT "6 Clicks" Daily Activity     Outcome Measure Help from another person eating meals?: A Little Help from another person taking care of personal grooming?: A Little Help from another person toileting, which includes using toliet, bedpan, or urinal?: A Little Help from another person bathing (including washing, rinsing, drying)?: A Lot Help from another person to put on and taking off regular upper body clothing?: A Little Help from another person to put on and taking off regular lower body clothing?: A Little 6 Click Score: 17   End of Session Equipment Utilized During Treatment: Gait belt Nurse Communication: Mobility status  Activity Tolerance: Patient tolerated treatment well Patient left: in bed;with call bell/phone within reach;with bed alarm set  OT Visit Diagnosis: Unsteadiness on feet (R26.81);Other abnormalities of gait and mobility (R26.89);Muscle weakness (generalized) (M62.81)                Time: 1610-9604 OT Time Calculation (min): 22 min Charges:  OT General Charges $OT Visit: 1 Visit OT Evaluation $OT Eval Low Complexity: 1 Low  Basha Krygier K, OTD, OTR/L SecureChat Preferred Acute Rehab [phone]   Carnetta Losada K Koonce 09/01/2023, 1:23 PM

## 2023-09-02 ENCOUNTER — Other Ambulatory Visit (HOSPITAL_COMMUNITY): Payer: Self-pay

## 2023-09-02 DIAGNOSIS — J101 Influenza due to other identified influenza virus with other respiratory manifestations: Secondary | ICD-10-CM | POA: Diagnosis not present

## 2023-09-02 LAB — CBC
HCT: 45.1 % (ref 36.0–46.0)
Hemoglobin: 14.3 g/dL (ref 12.0–15.0)
MCH: 25.7 pg — ABNORMAL LOW (ref 26.0–34.0)
MCHC: 31.7 g/dL (ref 30.0–36.0)
MCV: 81 fL (ref 80.0–100.0)
Platelets: 268 10*3/uL (ref 150–400)
RBC: 5.57 MIL/uL — ABNORMAL HIGH (ref 3.87–5.11)
RDW: 13.3 % (ref 11.5–15.5)
WBC: 3 10*3/uL — ABNORMAL LOW (ref 4.0–10.5)
nRBC: 0 % (ref 0.0–0.2)

## 2023-09-02 LAB — BASIC METABOLIC PANEL
Anion gap: 9 (ref 5–15)
BUN: 8 mg/dL (ref 8–23)
CO2: 19 mmol/L — ABNORMAL LOW (ref 22–32)
Calcium: 8 mg/dL — ABNORMAL LOW (ref 8.9–10.3)
Chloride: 111 mmol/L (ref 98–111)
Creatinine, Ser: 0.76 mg/dL (ref 0.44–1.00)
GFR, Estimated: >60 mL/min (ref >60)
Glucose, Bld: 89 mg/dL (ref 70–99)
Potassium: 3.6 mmol/L (ref 3.5–5.1)
Sodium: 139 mmol/L (ref 135–145)

## 2023-09-02 LAB — MAGNESIUM: Magnesium: 1.8 mg/dL (ref 1.7–2.4)

## 2023-09-02 LAB — GLUCOSE, CAPILLARY
Glucose-Capillary: 68 mg/dL — ABNORMAL LOW (ref 70–99)
Glucose-Capillary: 140 mg/dL — ABNORMAL HIGH (ref 70–99)

## 2023-09-02 LAB — PHOSPHORUS: Phosphorus: 3.1 mg/dL (ref 2.5–4.6)

## 2023-09-02 MED ORDER — LOPERAMIDE HCL 2 MG PO CAPS
4.0000 mg | ORAL_CAPSULE | ORAL | 0 refills | Status: AC | PRN
  Filled 2023-09-02: qty 20, 4d supply, fill #0

## 2023-09-02 MED ORDER — OSELTAMIVIR PHOSPHATE 75 MG PO CAPS
75.0000 mg | ORAL_CAPSULE | Freq: Two times a day (BID) | ORAL | 0 refills | Status: AC
  Filled 2023-09-02: qty 6, 3d supply, fill #0

## 2023-09-02 NOTE — Progress Notes (Signed)
 Mobility Specialist: Progress Note   09/02/23 1212  Mobility  Activity Ambulated with assistance in hallway  Level of Assistance Standby assist, set-up cues, supervision of patient - no hands on  Assistive Device None  Distance Ambulated (ft) 1000 ft  Activity Response Tolerated well  Mobility Referral Yes  Mobility visit 1 Mobility  Mobility Specialist Start Time (ACUTE ONLY) 1135  Mobility Specialist Stop Time (ACUTE ONLY) 1154  Mobility Specialist Time Calculation (min) (ACUTE ONLY) 19 min    Pt agreeable and eager for mobility - received in bed. Feeling much better today. SV throughout without AD. No complaints just feeling winded at EOS. Returned to room without fault. Left in bed with all needs met, call bell in reach.   Melinda Sanford Mobility Specialist Please contact via SecureChat or Rehab office at [phone]

## 2023-09-02 NOTE — Discharge Summary (Signed)
 Physician Discharge Summary  Melinda Sanford:[id number] DOB: 1946-05-20 DOA: 08/31/2023  PCP: Emilio Aspen, MD  Admit date: 08/31/2023 Discharge date: 09/02/2023  Admitted From: Home Disposition:  Home  Recommendations for Outpatient Follow-up:  Follow up with PCP in 1-2 weeks Please obtain BMP/CBC in one week your next doctors visit.  As needed Imodium given. Tamiflu prescribed to complete 5-day course  Discharge Condition: Stable CODE STATUS: Full code Diet recommendation: Low-salt  Brief/Interim Summary: Brief Narrative:  78 year old with history of HTN, asthma, prediabetes, anxiety, migraine, GERD comes to the hospital with cough, headaches and diarrhea.  She has been having the symptoms for about a week and was diagnosed with possible sinusitis by PCP and was given Augmentin.  Upon admission noted to have influenza A, generalized weakness and GI symptoms Patient feels great today.  No complaints.  Diarrhea has pretty much resolved, feels stronger.  Will discharge her today on p.o. Tamiflu.  Assessment & Plan:  Principal Problem:   Influenza A Active Problems:   Nausea, vomiting, and diarrhea   Generalized weakness   Hypokalemia   Hypertension   Erythrocytosis   Anxiety   GERD (gastroesophageal reflux disease)   Overweight    Influenza A Nausea, vomiting, diarrhea Acute infection  C. difficile is negative but I wonder if recent antibiotic use is causing some GI symptoms versus fluid cells.  No GI symptoms have resolved.  GI panel is negative.   Generalized weakness Feels much stronger today   Hypokalemia//hypocalcemia As needed repletion   Essential hypertension/hyperlipidemia Resume home regimen   Erythrocytosis Acute.  Hemoglobin noted to be elevated up to 17.     Anxiety -Continue citalopram   GERD -Continue Protonix   Overweight BMI 27.46 kg/m.  Patient is on Ozempic in the outpatient setting     DVT prophylaxis: Lovenox    Code  Status: Full Code Family Communication:   Discharge today  Subjective: Feeling well no complaints.  Tells me diarrhea has resolved  Examination:  General exam: Appears calm and comfortable  Respiratory system: Clear to auscultation. Respiratory effort normal. Cardiovascular system: S1 & S2 heard, RRR. No JVD, murmurs, rubs, gallops or clicks. No pedal edema. Gastrointestinal system: Abdomen is nondistended, soft and nontender. No organomegaly or masses felt. Normal bowel sounds heard. Central nervous system: Alert and oriented. No focal neurological deficits. Extremities: Symmetric 5 x 5 power. Skin: No rashes, lesions or ulcers Psychiatry: Judgement and insight appear normal. Mood & affect appropriate.    Discharge Diagnoses:  Principal Problem:   Influenza A Active Problems:   Nausea, vomiting, and diarrhea   Generalized weakness   Hypokalemia   Hypertension   Erythrocytosis   Anxiety   GERD (gastroesophageal reflux disease)   Overweight      Discharge Exam: Vitals:   09/02/23 0422 09/02/23 0753  BP: 136/71 135/63  Pulse: 80 83  Resp: 18 18  Temp: 97.7 F (36.5 C) 97.8 F (36.6 C)  SpO2: 100% 100%   Vitals:   09/01/23 0800 09/01/23 2137 09/02/23 0422 09/02/23 0753  BP: (!) 156/90 (!) 145/71 136/71 135/63  Pulse: 99 79 80 83  Resp: 18  18 18   Temp: 98.7 F (37.1 C) 97.8 F (36.6 C) 97.7 F (36.5 C) 97.8 F (36.6 C)  TempSrc: Oral Oral Oral Oral  SpO2: 100% 100% 100% 100%  Weight:      Height:          Discharge Instructions  Discharge Instructions     Ambulatory referral  to Physical Therapy   Complete by: As directed    Iontophoresis - 4 mg/ml of dexamethasone: No   T.E.N.S. Unit Evaluation and Dispense as Indicated: No      Allergies as of 09/02/2023       Reactions   Asa [aspirin]    Atenolol    headache   Codeine    Doxycycline    Gi upset stomach   Dyazide [hydrochlorothiazide W-triamterene]    Erythromycin Base Other (See  Comments)   Insomnia   Lactulose Other (See Comments)   headache   Levofloxacin Other (See Comments)   Caused her tongue to burn   Phenergan [promethazine]    Septra [sulfamethoxazole-trimethoprim]    headaches and dizziness   Sulfamethoxazole-trimethoprim Other (See Comments)   headaches and dizziness   Verapamil    headache   Zolpidem Other (See Comments)   headache        Medication List     STOP taking these medications    amoxicillin-clavulanate 875-125 MG tablet Commonly known as: AUGMENTIN       TAKE these medications    albuterol 108 (90 Base) MCG/ACT inhaler Commonly known as: VENTOLIN HFA SMARTSIG:2 Puff(s) By Mouth Every 4 Hours PRN   cholecalciferol 1000 units tablet Commonly known as: VITAMIN D Take 1,000 Units by mouth daily.   citalopram 20 MG tablet Commonly known as: CELEXA Take 20 mg by mouth daily.   cyclobenzaprine 10 MG tablet Commonly known as: FLEXERIL Take 10 mg by mouth 3 (three) times daily as needed for muscle spasms.   estradiol 1 MG tablet Commonly known as: ESTRACE Take 1 mg by mouth daily.   ferrous sulfate 325 (65 FE) MG tablet Take by mouth.   fluticasone 50 MCG/ACT nasal spray Commonly known as: FLONASE Place into the nose.   hydrochlorothiazide 50 MG tablet Commonly known as: HYDRODIURIL Take 50 mg by mouth every morning.   loperamide 2 MG capsule Commonly known as: IMODIUM Take 2 capsules (4 mg total) by mouth as needed for diarrhea or loose stools.   loratadine 10 MG tablet Commonly known as: CLARITIN Take 10 mg by mouth daily.   oseltamivir 75 MG capsule Commonly known as: TAMIFLU Take 1 capsule (75 mg total) by mouth 2 (two) times daily for 3 days.   oxyCODONE 5 MG immediate release tablet Commonly known as: Oxy IR/ROXICODONE Take 1 tablet by mouth every 4 (four) hours as needed.   Ozempic (1 MG/DOSE) 4 MG/3ML Sopn Generic drug: Semaglutide (1 MG/DOSE) INJECT 1 MG ONCE A WEEK   pantoprazole 20 MG  tablet Commonly known as: PROTONIX Take 20 mg by mouth daily.   pravastatin 40 MG tablet Commonly known as: PRAVACHOL Take 40 mg by mouth daily.   promethazine 25 MG tablet Commonly known as: PHENERGAN Take 25 mg by mouth daily as needed for nausea or vomiting.   topiramate 50 MG tablet Commonly known as: TOPAMAX Take 50 mg by mouth daily as needed.   Ubrelvy 100 MG Tabs Generic drug: Ubrogepant TAKE 1 TABLET BY MOUTH AS NEEDED FOR MIGRAINE. MAY REPEAT ONCE AFTER 2 HOURS               Durable Medical Equipment  (From admission, onward)           Start     Ordered   09/01/23 1040  For home use only DME Walker rolling  Once       Question Answer Comment  Walker: With 5 [employer]  Patient needs a walker to treat with the following condition Generalized weakness   Patient needs a walker to treat with the following condition Influenza      09/01/23 1039            Follow-up Information     [facility] Follow up.   Why: [facility] will deliver a RW to your hospital room before you are discharged home. Contact information: [address] [place] [address] [phone]         [facility]. Call.   Specialty: Rehabilitation Why: Please call the rehabilitation center and follow up regarding OUtpatient therapy. Contact information: [address] [place] [address] [phone]               Allergies  Allergen Reactions   Jonne Ply [Aspirin]    Atenolol     headache   Codeine    Doxycycline     Gi upset stomach   Dyazide [Hydrochlorothiazide W-Triamterene]    Erythromycin Base Other (See Comments)    Insomnia   Lactulose Other (See Comments)    headache   Levofloxacin Other (See Comments)    Caused her tongue to burn   Phenergan [Promethazine]    Septra [Sulfamethoxazole-Trimethoprim]     headaches and dizziness   Sulfamethoxazole-Trimethoprim Other  (See Comments)    headaches and dizziness   Verapamil     headache   Zolpidem Other (See Comments)    headache    You were cared for by a hospitalist during your hospital stay. If you have any questions about your discharge medications or the care you received while you were in the hospital after you are discharged, you can call the unit and asked to speak with the hospitalist on call if the hospitalist that took care of you is not available. Once you are discharged, your primary care physician will handle any further medical issues. Please note that no refills for any discharge medications will be authorized once you are discharged, as it is imperative that you return to your primary care physician (or establish a relationship with a primary care physician if you do not have one) for your aftercare needs so that they can reassess your need for medications and monitor your lab values.  You were cared for by a hospitalist during your hospital stay. If you have any questions about your discharge medications or the care you received while you were in the hospital after you are discharged, you can call the unit and asked to speak with the hospitalist on call if the hospitalist that took care of you is not available. Once you are discharged, your primary care physician will handle any further medical issues. Please note that NO REFILLS for any discharge medications will be authorized once you are discharged, as it is imperative that you return to your primary care physician (or establish a relationship with a primary care physician if you do not have one) for your aftercare needs so that they can reassess your need for medications and monitor your lab values.  Please request your Prim.MD to go over all Hospital Tests and Procedure/Radiological results at the follow up, please get all Hospital records sent to your Prim MD by signing hospital release before you go home.  Get CBC, CMP, 2 view Chest X ray  checked  by Primary MD during your next visit or SNF MD in 5-7 days ( we routinely change or add medications that  can affect your baseline labs and fluid status, therefore we recommend that you get the mentioned basic workup next visit with your PCP, your PCP may decide not to get them or add new tests based on their clinical decision)  On your next visit with your primary care physician please Get Medicines reviewed and adjusted.  If you experience worsening of your admission symptoms, develop shortness of breath, life threatening emergency, suicidal or homicidal thoughts you must seek medical attention immediately by calling 911 or calling your MD immediately  if symptoms less severe.  You Must read complete instructions/literature along with all the possible adverse reactions/side effects for all the Medicines you take and that have been prescribed to you. Take any new Medicines after you have completely understood and accpet all the possible adverse reactions/side effects.   Do not drive, operate heavy machinery, perform activities at heights, swimming or participation in water activities or provide baby sitting services if your were admitted for syncope or siezures until you have seen by Primary MD or a Neurologist and advised to do so again.  Do not drive when taking Pain medications.   Procedures/Studies: CT ANGIO HEAD NECK W WO CM Result Date: 08/31/2023 CLINICAL DATA:  Provided history: Neuro deficit, acute, stroke suspected. Left-sided weakness. EXAM: CT ANGIOGRAPHY HEAD AND NECK WITH AND WITHOUT CONTRAST TECHNIQUE: Multidetector CT imaging of the head and neck was performed using the standard protocol during bolus administration of intravenous contrast. Multiplanar CT image reconstructions and MIPs were obtained to evaluate the vascular anatomy. Carotid stenosis measurements (when applicable) are obtained utilizing NASCET criteria, using the distal internal carotid diameter as the  denominator. RADIATION DOSE REDUCTION: This exam was performed according to the departmental dose-optimization program which includes automated exposure control, adjustment of the mA and/or kV according to patient size and/or use of iterative reconstruction technique. CONTRAST:  75mL OMNIPAQUE IOHEXOL 350 MG/ML SOLN COMPARISON:  Head CT 09/24/2021.  MRI brain and MRA head 01/03/2009. FINDINGS: CT HEAD FINDINGS Brain: Generalized cerebral atrophy. Patchy and ill-defined hypoattenuation within the cerebral white matter, nonspecific but compatible with moderate chronic small vessel ischemic disease. There is no acute intracranial hemorrhage. No demarcated cortical infarct. No extra-axial fluid collection. No evidence of an intracranial mass. No midline shift. Vascular: No hyperdense vessel. Skull: No calvarial fracture or aggressive osseous lesion. Sinuses/Orbits: No orbital mass or acute orbital finding. No significant paranasal sinus disease. Review of the MIP images confirms the above findings CTA NECK FINDINGS Aortic arch: Common origin of the innominate and left common carotid arteries. A sclerotic plaque within the visualized aortic arch. Streak/beam hardening artifact arising from a dense contrast bolus partially obscures the left subclavian artery. Within this limitation, there is no appreciable hemodynamically significant innominate or proximal subclavian artery stenosis. Right carotid system: CCA and ICA patent within the neck without stenosis. Minimal atherosclerotic plaque within the carotid bulb. Left carotid system: CCA and ICA patent within the neck without stenosis or significant atherosclerotic disease. Vertebral arteries: Patent within the neck without stenosis or significant atherosclerotic disease. The right vertebral artery is dominant. Skeleton: Cervical spondylosis. No acute fracture or aggressive osseous lesion. Other neck: Multiple thyroid nodules measuring up to 10 mm, not meeting consensus  criteria for ultrasound follow-up based on size. No follow-up imaging recommended. Reference: J Am Coll Radiol. 2015 Feb;12(2): 143-50. Upper chest: No consolidation within the imaged lung apices. Review of the MIP images confirms the above findings CTA HEAD FINDINGS Anterior circulation: The intracranial internal carotid arteries are patent.  The M1 middle cerebral arteries are patent. No M2 proximal branch occlusion or high-grade proximal stenosis. The anterior cerebral arteries are patent. No intracranial aneurysm is identified. Posterior circulation: The intracranial vertebral arteries are patent. The basilar artery is patent. The posterior cerebral arteries are patent. The posterior cerebral arteries are patent. A right posterior communicating artery is present. The left posterior communicating artery is diminutive or absent. Venous sinuses: Within the limitations of contrast timing, no convincing thrombus. Anatomic variants: As described. Review of the MIP images confirms the above findings IMPRESSION: Non-contrast head CT: 1.  No evidence of an acute intracranial abnormality. 2. Parenchymal atrophy and chronic small vessel ischemic disease. CTA neck: 1. The common carotid, internal carotid and vertebral arteries are patent within the neck without stenosis. Minimal atherosclerotic plaque within the right carotid bulb. 2. Aortic Atherosclerosis (ICD10-I70.0). CTA head: No proximal intracranial large vessel occlusion or high-grade proximal arterial stenosis identified. Electronically Signed   By: Jackey Loge D.O.   On: 08/31/2023 11:13   DG Chest Portable 1 View Result Date: 08/31/2023 CLINICAL DATA:  Cough and congestion.  Headache. EXAM: PORTABLE CHEST 1 VIEW COMPARISON:  None Available. FINDINGS: Bilateral lung fields are clear. Bilateral costophrenic angles are clear. Normal cardio-mediastinal silhouette. No acute osseous abnormalities. The soft tissues are within normal limits. IMPRESSION: No active  disease. Electronically Signed   By: Jules Schick M.D.   On: 08/31/2023 09:07     The results of significant diagnostics from this hospitalization (including imaging, microbiology, ancillary and laboratory) are listed below for reference.     Microbiology: Recent Results (from the past 240 hours)  Resp panel by RT-PCR (RSV, Flu A&B, Covid) Anterior Nasal Swab     Status: Abnormal   Collection Time: 08/31/23  9:27 AM   Specimen: Anterior Nasal Swab  Result Value Ref Range Status   SARS Coronavirus 2 by RT PCR NEGATIVE NEGATIVE Final   Influenza A by PCR POSITIVE (A) NEGATIVE Final   Influenza B by PCR NEGATIVE NEGATIVE Final    Comment: (NOTE) The Xpert Xpress SARS-CoV-2/FLU/RSV plus assay is intended as an aid in the diagnosis of influenza from Nasopharyngeal swab specimens and should not be used as a sole basis for treatment. Nasal washings and aspirates are unacceptable for Xpert Xpress SARS-CoV-2/FLU/RSV testing.  Fact Sheet for Patients: BloggerCourse.com  Fact Sheet for Healthcare Providers: SeriousBroker.it  This test is not yet approved or cleared by the [place] FDA and has been authorized for detection and/or diagnosis of SARS-CoV-2 by FDA under an Emergency Use Authorization (EUA). This EUA will remain in effect (meaning this test can be used) for the duration of the COVID-19 declaration under Section 564(b)(1) of the Act, 21 U.S.C. section 360bbb-3(b)(1), unless the authorization is terminated or revoked.     Resp Syncytial Virus by PCR NEGATIVE NEGATIVE Final    Comment: (NOTE) Fact Sheet for Patients: BloggerCourse.com  Fact Sheet for Healthcare Providers: SeriousBroker.it  This test is not yet approved or cleared by the [place] FDA and has been authorized for detection and/or diagnosis of SARS-CoV-2 by FDA under an Emergency Use Authorization  (EUA). This EUA will remain in effect (meaning this test can be used) for the duration of the COVID-19 declaration under Section 564(b)(1) of the Act, 21 U.S.C. section 360bbb-3(b)(1), unless the authorization is terminated or revoked.  Performed at [facility], [address] [place], [place] [address]   C Difficile Quick Screen w PCR reflex     Status:  None   Collection Time: 08/31/23  8:43 PM   Specimen: STOOL  Result Value Ref Range Status   C Diff antigen NEGATIVE NEGATIVE Final   C Diff toxin NEGATIVE NEGATIVE Final   C Diff interpretation No C. difficile detected.  Final    Comment: Performed at [facility], [address] [place], [place] [address]  Gastrointestinal Panel by PCR , Stool     Status: None   Collection Time: 08/31/23  8:43 PM   Specimen: Stool  Result Value Ref Range Status   Campylobacter species NOT DETECTED NOT DETECTED Final   Plesimonas shigelloides NOT DETECTED NOT DETECTED Final   Salmonella species NOT DETECTED NOT DETECTED Final   Yersinia enterocolitica NOT DETECTED NOT DETECTED Final   Vibrio species NOT DETECTED NOT DETECTED Final   Vibrio cholerae NOT DETECTED NOT DETECTED Final   Enteroaggregative E coli (EAEC) NOT DETECTED NOT DETECTED Final   Enteropathogenic E coli (EPEC) NOT DETECTED NOT DETECTED Final   Enterotoxigenic E coli (ETEC) NOT DETECTED NOT DETECTED Final   Shiga like toxin producing E coli (STEC) NOT DETECTED NOT DETECTED Final   Shigella/Enteroinvasive E coli (EIEC) NOT DETECTED NOT DETECTED Final   Cryptosporidium NOT DETECTED NOT DETECTED Final   Cyclospora cayetanensis NOT DETECTED NOT DETECTED Final   Entamoeba histolytica NOT DETECTED NOT DETECTED Final   Giardia lamblia NOT DETECTED NOT DETECTED Final   Adenovirus F40/41 NOT DETECTED NOT DETECTED Final   Astrovirus NOT DETECTED NOT DETECTED Final   Norovirus GI/GII NOT DETECTED NOT DETECTED Final   Rotavirus A NOT DETECTED NOT DETECTED Final   Sapovirus  (I, II, IV, and V) NOT DETECTED NOT DETECTED Final    Comment: Performed at [facility], [address] [place], [place] [address]     Labs: BNP (last 3 results) No results for input(s): "BNP" in the last 8760 hours. Basic Metabolic Panel: Recent Labs  Lab 08/31/23 0927 08/31/23 0957 09/01/23 0609 09/02/23 0559  NA 136 136 138 139  K 3.2* 3.2* 3.8 3.6  CL 99 102 107 111  CO2 22  --  17* 19*  GLUCOSE 147* 142* 162* 89  BUN 12 12 8 8   CREATININE 0.89 0.80 0.75 0.76  CALCIUM 9.4  --  8.8* 8.0*  MG 2.2  --   --  1.8  PHOS  --   --   --  3.1   Liver Function Tests: No results for input(s): "AST", "ALT", "ALKPHOS", "BILITOT", "PROT", "ALBUMIN" in the last 168 hours. No results for input(s): "LIPASE", "AMYLASE" in the last 168 hours. No results for input(s): "AMMONIA" in the last 168 hours. CBC: Recent Labs  Lab 08/31/23 0927 08/31/23 0957 09/01/23 0609 09/02/23 0559  WBC 4.6  --  5.4 3.0*  NEUTROABS 3.2  --   --   --   HGB 17.0* 17.3* 17.8* 14.3  HCT 52.4* 51.0* 56.4* 45.1  MCV 80.1  --  81.2 81.0  PLT 287  --  297 268   Cardiac Enzymes: No results for input(s): "CKTOTAL", "CKMB", "CKMBINDEX", "TROPONINI" in the last 168 hours. BNP: Invalid input(s): "POCBNP" CBG: Recent Labs  Lab 08/31/23 0817 08/31/23 1545 08/31/23 1932 09/02/23 0747 09/02/23 0838  GLUCAP 179* 135* 128* 68* 140*   D-Dimer No results for input(s): "DDIMER" in the last 72 hours. Hgb A1c No results for input(s): "HGBA1C" in the last 72 hours. Lipid Profile No results for input(s): "CHOL", "HDL", "LDLCALC", "TRIG", "CHOLHDL", "LDLDIRECT" in the last 72  hours. Thyroid function studies No results for input(s): "TSH", "T4TOTAL", "T3FREE", "THYROIDAB" in the last 72 hours.  Invalid input(s): "FREET3" Anemia work up No results for input(s): "VITAMINB12", "FOLATE", "FERRITIN", "TIBC", "IRON", "RETICCTPCT" in the last 72 hours. Urinalysis No results found for: "COLORURINE",  "APPEARANCEUR", "LABSPEC", "PHURINE", "GLUCOSEU", "HGBUR", "BILIRUBINUR", "KETONESUR", "PROTEINUR", "UROBILINOGEN", "NITRITE", "LEUKOCYTESUR" Sepsis Labs Recent Labs  Lab 08/31/23 0927 09/01/23 0609 09/02/23 0559  WBC 4.6 5.4 3.0*   Microbiology Recent Results (from the past 240 hours)  Resp panel by RT-PCR (RSV, Flu A&B, Covid) Anterior Nasal Swab     Status: Abnormal   Collection Time: 08/31/23  9:27 AM   Specimen: Anterior Nasal Swab  Result Value Ref Range Status   SARS Coronavirus 2 by RT PCR NEGATIVE NEGATIVE Final   Influenza A by PCR POSITIVE (A) NEGATIVE Final   Influenza B by PCR NEGATIVE NEGATIVE Final    Comment: (NOTE) The Xpert Xpress SARS-CoV-2/FLU/RSV plus assay is intended as an aid in the diagnosis of influenza from Nasopharyngeal swab specimens and should not be used as a sole basis for treatment. Nasal washings and aspirates are unacceptable for Xpert Xpress SARS-CoV-2/FLU/RSV testing.  Fact Sheet for Patients: BloggerCourse.com  Fact Sheet for Healthcare Providers: SeriousBroker.it  This test is not yet approved or cleared by the [place] FDA and has been authorized for detection and/or diagnosis of SARS-CoV-2 by FDA under an Emergency Use Authorization (EUA). This EUA will remain in effect (meaning this test can be used) for the duration of the COVID-19 declaration under Section 564(b)(1) of the Act, 21 U.S.C. section 360bbb-3(b)(1), unless the authorization is terminated or revoked.     Resp Syncytial Virus by PCR NEGATIVE NEGATIVE Final    Comment: (NOTE) Fact Sheet for Patients: BloggerCourse.com  Fact Sheet for Healthcare Providers: SeriousBroker.it  This test is not yet approved or cleared by the [place] FDA and has been authorized for detection and/or diagnosis of SARS-CoV-2 by FDA under an Emergency Use Authorization (EUA). This  EUA will remain in effect (meaning this test can be used) for the duration of the COVID-19 declaration under Section 564(b)(1) of the Act, 21 U.S.C. section 360bbb-3(b)(1), unless the authorization is terminated or revoked.  Performed at [facility], [address], [place], [place] [address]   C Difficile Quick Screen w PCR reflex     Status: None   Collection Time: 08/31/23  8:43 PM   Specimen: STOOL  Result Value Ref Range Status   C Diff antigen NEGATIVE NEGATIVE Final   C Diff toxin NEGATIVE NEGATIVE Final   C Diff interpretation No C. difficile detected.  Final    Comment: Performed at [facility], [address], [place], [place] [address]  Gastrointestinal Panel by PCR , Stool     Status: None   Collection Time: 08/31/23  8:43 PM   Specimen: Stool  Result Value Ref Range Status   Campylobacter species NOT DETECTED NOT DETECTED Final   Plesimonas shigelloides NOT DETECTED NOT DETECTED Final   Salmonella species NOT DETECTED NOT DETECTED Final   Yersinia enterocolitica NOT DETECTED NOT DETECTED Final   Vibrio species NOT DETECTED NOT DETECTED Final   Vibrio cholerae NOT DETECTED NOT DETECTED Final   Enteroaggregative E coli (EAEC) NOT DETECTED NOT DETECTED Final   Enteropathogenic E coli (EPEC) NOT DETECTED NOT DETECTED Final   Enterotoxigenic E coli (ETEC) NOT DETECTED NOT DETECTED Final   Shiga like toxin producing E coli (STEC) NOT DETECTED NOT DETECTED  Final   Shigella/Enteroinvasive E coli (EIEC) NOT DETECTED NOT DETECTED Final   Cryptosporidium NOT DETECTED NOT DETECTED Final   Cyclospora cayetanensis NOT DETECTED NOT DETECTED Final   Entamoeba histolytica NOT DETECTED NOT DETECTED Final   Giardia lamblia NOT DETECTED NOT DETECTED Final   Adenovirus F40/41 NOT DETECTED NOT DETECTED Final   Astrovirus NOT DETECTED NOT DETECTED Final   Norovirus GI/GII NOT DETECTED NOT DETECTED Final   Rotavirus A NOT DETECTED NOT DETECTED Final   Sapovirus (I, II, IV,  and V) NOT DETECTED NOT DETECTED Final    Comment: Performed at [facility], [address] [place], [place] [address]     Time coordinating discharge:  I have spent 35 minutes face to face with the patient and on the ward discussing the patients care, assessment, plan and disposition with other care givers. >50% of the time was devoted counseling the patient about the risks and benefits of treatment/Discharge disposition and coordinating care.   SIGNED:   Miguel Rota, MD  [facility] 09/02/2023, 1:11 PM   If 7PM-7AM, please contact night-coverage

## 2023-09-02 NOTE — Progress Notes (Signed)
 Patient with discharge orders. Patient agrees and is ready for discharge. This RN went over discharge instructions, medications and follow up appointments.Removed PIV, applied dry dressing with tape. Patient A&Ox4. No acute distress noted. Patient voice no complaints at this time. Patient belongings, rolling walker and medications given to her and transport placed for discharge lounge.

## 2023-09-02 NOTE — Plan of Care (Signed)
  Problem: Education: Goal: Knowledge of General Education information will improve Description: Including pain rating scale, medication(s)/side effects and non-pharmacologic comfort measures 09/02/2023 0410 by Agnes Lawrence, RN Outcome: Progressing 09/02/2023 0410 by Agnes Lawrence, RN Outcome: Progressing   Problem: Health Behavior/Discharge Planning: Goal: Ability to manage health-related needs will improve 09/02/2023 0410 by Agnes Lawrence, RN Outcome: Progressing 09/02/2023 0410 by Agnes Lawrence, RN Outcome: Progressing   Problem: Clinical Measurements: Goal: Ability to maintain clinical measurements within normal limits will improve 09/02/2023 0410 by Agnes Lawrence, RN Outcome: Progressing 09/02/2023 0410 by Agnes Lawrence, RN Outcome: Progressing Goal: Will remain free from infection 09/02/2023 0410 by Agnes Lawrence, RN Outcome: Progressing 09/02/2023 0410 by Agnes Lawrence, RN Outcome: Progressing Goal: Diagnostic test results will improve 09/02/2023 0410 by Agnes Lawrence, RN Outcome: Progressing 09/02/2023 0410 by Agnes Lawrence, RN Outcome: Progressing Goal: Respiratory complications will improve 09/02/2023 0410 by Agnes Lawrence, RN Outcome: Progressing 09/02/2023 0410 by Agnes Lawrence, RN Outcome: Progressing Goal: Cardiovascular complication will be avoided 09/02/2023 0410 by Agnes Lawrence, RN Outcome: Progressing 09/02/2023 0410 by Agnes Lawrence, RN Outcome: Progressing   Problem: Activity: Goal: Risk for activity intolerance will decrease 09/02/2023 0410 by Agnes Lawrence, RN Outcome: Progressing 09/02/2023 0410 by Agnes Lawrence, RN Outcome: Progressing   Problem: Nutrition: Goal: Adequate nutrition will be maintained 09/02/2023 0410 by Agnes Lawrence, RN Outcome: Progressing 09/02/2023 0410 by Agnes Lawrence, RN Outcome: Progressing   Problem: Coping: Goal: Level of anxiety will decrease 09/02/2023 0410 by Agnes Lawrence, RN Outcome: Progressing 09/02/2023 0410 by  Agnes Lawrence, RN Outcome: Progressing   Problem: Elimination: Goal: Will not experience complications related to bowel motility 09/02/2023 0410 by Agnes Lawrence, RN Outcome: Progressing 09/02/2023 0410 by Agnes Lawrence, RN Outcome: Progressing Goal: Will not experience complications related to urinary retention 09/02/2023 0410 by Agnes Lawrence, RN Outcome: Progressing 09/02/2023 0410 by Agnes Lawrence, RN Outcome: Progressing   Problem: Pain Managment: Goal: General experience of comfort will improve and/or be controlled 09/02/2023 0410 by Agnes Lawrence, RN Outcome: Progressing 09/02/2023 0410 by Agnes Lawrence, RN Outcome: Progressing   Problem: Safety: Goal: Ability to remain free from injury will improve 09/02/2023 0410 by Agnes Lawrence, RN Outcome: Progressing 09/02/2023 0410 by Agnes Lawrence, RN Outcome: Progressing   Problem: Skin Integrity: Goal: Risk for impaired skin integrity will decrease 09/02/2023 0410 by Agnes Lawrence, RN Outcome: Progressing 09/02/2023 0410 by Agnes Lawrence, RN Outcome: Progressing

## 2023-09-02 NOTE — Progress Notes (Signed)
 PROGRESS NOTE    Melinda Sanford  ZOX:[id number] DOB: [birth year] DOA: 08/31/2023 PCP: Emilio Aspen, MD    Brief Narrative:  78 year old with history of HTN, asthma, prediabetes, anxiety, migraine, GERD comes to the hospital with cough, headaches and diarrhea.  She has been having the symptoms for about a week and was diagnosed with possible sinusitis by PCP and was given Augmentin.  Upon admission noted to have influenza A, generalized weakness and GI symptoms Patient feels great today.  No complaints.  Diarrhea has pretty much resolved, feels stronger.  Will discharge her today on p.o. Tamiflu.  Assessment & Plan:  Principal Problem:   Influenza A Active Problems:   Nausea, vomiting, and diarrhea   Generalized weakness   Hypokalemia   Hypertension   Erythrocytosis   Anxiety   GERD (gastroesophageal reflux disease)   Overweight    Influenza A Nausea, vomiting, diarrhea Acute infection  C. difficile is negative but I wonder if recent antibiotic use is causing some GI symptoms versus fluid cells.  No GI symptoms have resolved.  GI panel is negative.   Generalized weakness Feels much stronger today   Hypokalemia//hypocalcemia As needed repletion   Essential hypertension/hyperlipidemia Resume home regimen   Erythrocytosis Acute.  Hemoglobin noted to be elevated up to 17.     Anxiety -Continue citalopram   GERD -Continue Protonix   Overweight BMI 27.46 kg/m.  Patient is on Ozempic in the outpatient setting     DVT prophylaxis: Lovenox    Code Status: Full Code Family Communication:   Discharge today  Subjective: Feeling well no complaints.  Tells me diarrhea has resolved  Examination:  General exam: Appears calm and comfortable  Respiratory system: Clear to auscultation. Respiratory effort normal. Cardiovascular system: S1 & S2 heard, RRR. No JVD, murmurs, rubs, gallops or clicks. No pedal edema. Gastrointestinal system: Abdomen is nondistended,  soft and nontender. No organomegaly or masses felt. Normal bowel sounds heard. Central nervous system: Alert and oriented. No focal neurological deficits. Extremities: Symmetric 5 x 5 power. Skin: No rashes, lesions or ulcers Psychiatry: Judgement and insight appear normal. Mood & affect appropriate.                Diet Orders (From admission, onward)     Start     Ordered   09/02/23 0951  DIET SOFT Room service appropriate? Yes; Fluid consistency: Thin  Diet effective now       Question Answer Comment  Room service appropriate? Yes   Fluid consistency: Thin      09/02/23 0950            Objective: Vitals:   09/01/23 0800 09/01/23 2137 09/02/23 0422 09/02/23 0753  BP: (!) 156/90 (!) 145/71 136/71 135/63  Pulse: 99 79 80 83  Resp: 18  18 18   Temp: 98.7 F (37.1 C) 97.8 F (36.6 C) 97.7 F (36.5 C) 97.8 F (36.6 C)  TempSrc: Oral Oral Oral Oral  SpO2: 100% 100% 100% 100%  Weight:      Height:        Intake/Output Summary (Last 24 hours) at 09/02/2023 1311 Last data filed at 09/02/2023 0130 Gross per 24 hour  Intake 1792.75 ml  Output --  Net 1792.75 ml   Filed Weights   08/31/23 1058  Weight: 72.6 kg    Scheduled Meds:  cholecalciferol  1,000 Units Oral Daily   citalopram  20 mg Oral Daily   enoxaparin (LOVENOX) injection  40 mg Subcutaneous Q24H  estradiol  1 mg Oral Daily   feeding supplement  1 Container Oral TID BM   guaiFENesin  600 mg Oral BID   oseltamivir  75 mg Oral BID   pantoprazole  20 mg Oral Daily   pravastatin  40 mg Oral Daily   sodium chloride flush  3 mL Intravenous Q12H   Continuous Infusions:  sodium chloride 75 mL/hr at 09/02/23 0128    Nutritional status     Body mass index is 27.46 kg/m.  Data Reviewed:   CBC: Recent Labs  Lab 08/31/23 0927 08/31/23 0957 09/01/23 0609 09/02/23 0559  WBC 4.6  --  5.4 3.0*  NEUTROABS 3.2  --   --   --   HGB 17.0* 17.3* 17.8* 14.3  HCT 52.4* 51.0* 56.4* 45.1  MCV 80.1   --  81.2 81.0  PLT 287  --  297 268   Basic Metabolic Panel: Recent Labs  Lab 08/31/23 0927 08/31/23 0957 09/01/23 0609 09/02/23 0559  NA 136 136 138 139  K 3.2* 3.2* 3.8 3.6  CL 99 102 107 111  CO2 22  --  17* 19*  GLUCOSE 147* 142* 162* 89  BUN 12 12 8 8   CREATININE 0.89 0.80 0.75 0.76  CALCIUM 9.4  --  8.8* 8.0*  MG 2.2  --   --  1.8  PHOS  --   --   --  3.1   GFR: Estimated Creatinine Clearance: 59.4 mL/min (by C-G formula based on SCr of 0.76 mg/dL). Liver Function Tests: No results for input(s): "AST", "ALT", "ALKPHOS", "BILITOT", "PROT", "ALBUMIN" in the last 168 hours. No results for input(s): "LIPASE", "AMYLASE" in the last 168 hours. No results for input(s): "AMMONIA" in the last 168 hours. Coagulation Profile: No results for input(s): "INR", "PROTIME" in the last 168 hours. Cardiac Enzymes: No results for input(s): "CKTOTAL", "CKMB", "CKMBINDEX", "TROPONINI" in the last 168 hours. BNP (last 3 results) No results for input(s): "PROBNP" in the last 8760 hours. HbA1C: No results for input(s): "HGBA1C" in the last 72 hours. CBG: Recent Labs  Lab 08/31/23 0817 08/31/23 1545 08/31/23 1932 09/02/23 0747 09/02/23 0838  GLUCAP 179* 135* 128* 68* 140*   Lipid Profile: No results for input(s): "CHOL", "HDL", "LDLCALC", "TRIG", "CHOLHDL", "LDLDIRECT" in the last 72 hours. Thyroid Function Tests: No results for input(s): "TSH", "T4TOTAL", "FREET4", "T3FREE", "THYROIDAB" in the last 72 hours. Anemia Panel: No results for input(s): "VITAMINB12", "FOLATE", "FERRITIN", "TIBC", "IRON", "RETICCTPCT" in the last 72 hours. Sepsis Labs: Recent Labs  Lab 08/31/23 0927  PROCALCITON 0.17    Recent Results (from the past 240 hours)  Resp panel by RT-PCR (RSV, Flu A&B, Covid) Anterior Nasal Swab     Status: Abnormal   Collection Time: 08/31/23  9:27 AM   Specimen: Anterior Nasal Swab  Result Value Ref Range Status   SARS Coronavirus 2 by RT PCR NEGATIVE NEGATIVE Final    Influenza A by PCR POSITIVE (A) NEGATIVE Final   Influenza B by PCR NEGATIVE NEGATIVE Final    Comment: (NOTE) The Xpert Xpress SARS-CoV-2/FLU/RSV plus assay is intended as an aid in the diagnosis of influenza from Nasopharyngeal swab specimens and should not be used as a sole basis for treatment. Nasal washings and aspirates are unacceptable for Xpert Xpress SARS-CoV-2/FLU/RSV testing.  Fact Sheet for Patients: BloggerCourse.com  Fact Sheet for Healthcare Providers: SeriousBroker.it  This test is not yet approved or cleared by the [place] FDA and has been authorized for detection and/or diagnosis of  SARS-CoV-2 by FDA under an Emergency Use Authorization (EUA). This EUA will remain in effect (meaning this test can be used) for the duration of the COVID-19 declaration under Section 564(b)(1) of the Act, 21 U.S.C. section 360bbb-3(b)(1), unless the authorization is terminated or revoked.     Resp Syncytial Virus by PCR NEGATIVE NEGATIVE Final    Comment: (NOTE) Fact Sheet for Patients: BloggerCourse.com  Fact Sheet for Healthcare Providers: SeriousBroker.it  This test is not yet approved or cleared by the [place] FDA and has been authorized for detection and/or diagnosis of SARS-CoV-2 by FDA under an Emergency Use Authorization (EUA). This EUA will remain in effect (meaning this test can be used) for the duration of the COVID-19 declaration under Section 564(b)(1) of the Act, 21 U.S.C. section 360bbb-3(b)(1), unless the authorization is terminated or revoked.  Performed at [facility], [address], [place], [place] [address]   C Difficile Quick Screen w PCR reflex     Status: None   Collection Time: 08/31/23  8:43 PM   Specimen: STOOL  Result Value Ref Range Status   C Diff antigen NEGATIVE NEGATIVE Final   C Diff toxin NEGATIVE NEGATIVE Final   C  Diff interpretation No C. difficile detected.  Final    Comment: Performed at [facility], [address], [place], [place] [address]  Gastrointestinal Panel by PCR , Stool     Status: None   Collection Time: 08/31/23  8:43 PM   Specimen: Stool  Result Value Ref Range Status   Campylobacter species NOT DETECTED NOT DETECTED Final   Plesimonas shigelloides NOT DETECTED NOT DETECTED Final   Salmonella species NOT DETECTED NOT DETECTED Final   Yersinia enterocolitica NOT DETECTED NOT DETECTED Final   Vibrio species NOT DETECTED NOT DETECTED Final   Vibrio cholerae NOT DETECTED NOT DETECTED Final   Enteroaggregative E coli (EAEC) NOT DETECTED NOT DETECTED Final   Enteropathogenic E coli (EPEC) NOT DETECTED NOT DETECTED Final   Enterotoxigenic E coli (ETEC) NOT DETECTED NOT DETECTED Final   Shiga like toxin producing E coli (STEC) NOT DETECTED NOT DETECTED Final   Shigella/Enteroinvasive E coli (EIEC) NOT DETECTED NOT DETECTED Final   Cryptosporidium NOT DETECTED NOT DETECTED Final   Cyclospora cayetanensis NOT DETECTED NOT DETECTED Final   Entamoeba histolytica NOT DETECTED NOT DETECTED Final   Giardia lamblia NOT DETECTED NOT DETECTED Final   Adenovirus F40/41 NOT DETECTED NOT DETECTED Final   Astrovirus NOT DETECTED NOT DETECTED Final   Norovirus GI/GII NOT DETECTED NOT DETECTED Final   Rotavirus A NOT DETECTED NOT DETECTED Final   Sapovirus (I, II, IV, and V) NOT DETECTED NOT DETECTED Final    Comment: Performed at [facility], [address] [place], [place] [address]         Radiology Studies: No results found.         LOS: 1 day   Time spent= 35 mins    Miguel Rota, MD [facility]  If 7PM-7AM, please contact night-coverage  09/02/2023, 1:11 PM

## 2023-09-02 NOTE — Progress Notes (Signed)
 Patient able to tolerate three medications at this time.Wants to retry other a.m. medications in an hour.

## 2023-09-02 NOTE — Progress Notes (Signed)
 TOC meds picked up from pharmacy and delivered to patient at the bedside.

## 2023-09-02 NOTE — Plan of Care (Signed)

## 2023-09-02 NOTE — Plan of Care (Signed)

## 2023-09-16 DIAGNOSIS — M542 Cervicalgia: Secondary | ICD-10-CM | POA: Diagnosis not present

## 2023-09-16 DIAGNOSIS — E1140 Type 2 diabetes mellitus with diabetic neuropathy, unspecified: Secondary | ICD-10-CM | POA: Diagnosis not present

## 2023-09-16 DIAGNOSIS — G9331 Postviral fatigue syndrome: Secondary | ICD-10-CM | POA: Diagnosis not present

## 2023-09-16 DIAGNOSIS — G43719 Chronic migraine without aura, intractable, without status migrainosus: Secondary | ICD-10-CM | POA: Diagnosis not present

## 2023-09-16 DIAGNOSIS — G43009 Migraine without aura, not intractable, without status migrainosus: Secondary | ICD-10-CM | POA: Diagnosis not present

## 2023-09-16 DIAGNOSIS — I10 Essential (primary) hypertension: Secondary | ICD-10-CM | POA: Diagnosis not present

## 2023-09-16 DIAGNOSIS — J4541 Moderate persistent asthma with (acute) exacerbation: Secondary | ICD-10-CM | POA: Diagnosis not present

## 2023-09-16 DIAGNOSIS — M791 Myalgia, unspecified site: Secondary | ICD-10-CM | POA: Diagnosis not present

## 2023-09-16 DIAGNOSIS — E611 Iron deficiency: Secondary | ICD-10-CM | POA: Diagnosis not present

## 2023-09-16 DIAGNOSIS — E1159 Type 2 diabetes mellitus with other circulatory complications: Secondary | ICD-10-CM | POA: Diagnosis not present

## 2023-10-03 DIAGNOSIS — G43719 Chronic migraine without aura, intractable, without status migrainosus: Secondary | ICD-10-CM | POA: Diagnosis not present

## 2023-10-03 DIAGNOSIS — M542 Cervicalgia: Secondary | ICD-10-CM | POA: Diagnosis not present

## 2023-10-03 DIAGNOSIS — M791 Myalgia, unspecified site: Secondary | ICD-10-CM | POA: Diagnosis not present

## 2023-10-06 DIAGNOSIS — M1712 Unilateral primary osteoarthritis, left knee: Secondary | ICD-10-CM | POA: Diagnosis not present

## 2023-10-08 DIAGNOSIS — E1159 Type 2 diabetes mellitus with other circulatory complications: Secondary | ICD-10-CM | POA: Diagnosis not present

## 2023-10-08 DIAGNOSIS — E1165 Type 2 diabetes mellitus with hyperglycemia: Secondary | ICD-10-CM | POA: Diagnosis not present

## 2023-10-08 DIAGNOSIS — G43009 Migraine without aura, not intractable, without status migrainosus: Secondary | ICD-10-CM | POA: Diagnosis not present

## 2023-10-08 DIAGNOSIS — E611 Iron deficiency: Secondary | ICD-10-CM | POA: Diagnosis not present

## 2023-10-08 DIAGNOSIS — Z23 Encounter for immunization: Secondary | ICD-10-CM | POA: Diagnosis not present

## 2023-10-08 DIAGNOSIS — I10 Essential (primary) hypertension: Secondary | ICD-10-CM | POA: Diagnosis not present

## 2023-10-08 DIAGNOSIS — J4541 Moderate persistent asthma with (acute) exacerbation: Secondary | ICD-10-CM | POA: Diagnosis not present

## 2023-10-08 DIAGNOSIS — G8929 Other chronic pain: Secondary | ICD-10-CM | POA: Diagnosis not present

## 2023-10-08 DIAGNOSIS — M545 Low back pain, unspecified: Secondary | ICD-10-CM | POA: Diagnosis not present

## 2023-10-08 DIAGNOSIS — Z0000 Encounter for general adult medical examination without abnormal findings: Secondary | ICD-10-CM | POA: Diagnosis not present

## 2023-10-08 DIAGNOSIS — E039 Hypothyroidism, unspecified: Secondary | ICD-10-CM | POA: Diagnosis not present

## 2023-10-08 DIAGNOSIS — E1140 Type 2 diabetes mellitus with diabetic neuropathy, unspecified: Secondary | ICD-10-CM | POA: Diagnosis not present

## 2023-10-14 DIAGNOSIS — M961 Postlaminectomy syndrome, not elsewhere classified: Secondary | ICD-10-CM | POA: Diagnosis not present

## 2023-10-14 DIAGNOSIS — G894 Chronic pain syndrome: Secondary | ICD-10-CM | POA: Diagnosis not present

## 2023-10-14 DIAGNOSIS — M47816 Spondylosis without myelopathy or radiculopathy, lumbar region: Secondary | ICD-10-CM | POA: Diagnosis not present

## 2023-10-14 DIAGNOSIS — Z79891 Long term (current) use of opiate analgesic: Secondary | ICD-10-CM | POA: Diagnosis not present

## 2023-10-15 DIAGNOSIS — E611 Iron deficiency: Secondary | ICD-10-CM | POA: Diagnosis not present

## 2023-10-17 DIAGNOSIS — G43719 Chronic migraine without aura, intractable, without status migrainosus: Secondary | ICD-10-CM | POA: Diagnosis not present

## 2023-10-17 DIAGNOSIS — M791 Myalgia, unspecified site: Secondary | ICD-10-CM | POA: Diagnosis not present

## 2023-10-17 DIAGNOSIS — M542 Cervicalgia: Secondary | ICD-10-CM | POA: Diagnosis not present

## 2023-10-24 DIAGNOSIS — M25512 Pain in left shoulder: Secondary | ICD-10-CM | POA: Diagnosis not present

## 2023-10-24 DIAGNOSIS — M25561 Pain in right knee: Secondary | ICD-10-CM | POA: Diagnosis not present

## 2023-11-07 DIAGNOSIS — M542 Cervicalgia: Secondary | ICD-10-CM | POA: Diagnosis not present

## 2023-11-07 DIAGNOSIS — G43719 Chronic migraine without aura, intractable, without status migrainosus: Secondary | ICD-10-CM | POA: Diagnosis not present

## 2023-11-07 DIAGNOSIS — M791 Myalgia, unspecified site: Secondary | ICD-10-CM | POA: Diagnosis not present

## 2023-11-13 DIAGNOSIS — R195 Other fecal abnormalities: Secondary | ICD-10-CM | POA: Diagnosis not present

## 2023-11-13 DIAGNOSIS — Z860100 Personal history of colon polyps, unspecified: Secondary | ICD-10-CM | POA: Diagnosis not present

## 2023-11-15 DIAGNOSIS — E1165 Type 2 diabetes mellitus with hyperglycemia: Secondary | ICD-10-CM | POA: Diagnosis not present

## 2023-11-15 DIAGNOSIS — E119 Type 2 diabetes mellitus without complications: Secondary | ICD-10-CM | POA: Diagnosis not present

## 2023-11-15 DIAGNOSIS — J4540 Moderate persistent asthma, uncomplicated: Secondary | ICD-10-CM | POA: Diagnosis not present

## 2023-11-27 DIAGNOSIS — M542 Cervicalgia: Secondary | ICD-10-CM | POA: Diagnosis not present

## 2023-11-27 DIAGNOSIS — M791 Myalgia, unspecified site: Secondary | ICD-10-CM | POA: Diagnosis not present

## 2023-11-27 DIAGNOSIS — G43719 Chronic migraine without aura, intractable, without status migrainosus: Secondary | ICD-10-CM | POA: Diagnosis not present

## 2023-12-04 DIAGNOSIS — M961 Postlaminectomy syndrome, not elsewhere classified: Secondary | ICD-10-CM | POA: Diagnosis not present

## 2023-12-04 DIAGNOSIS — G894 Chronic pain syndrome: Secondary | ICD-10-CM | POA: Diagnosis not present

## 2023-12-04 DIAGNOSIS — Z79891 Long term (current) use of opiate analgesic: Secondary | ICD-10-CM | POA: Diagnosis not present

## 2023-12-04 DIAGNOSIS — M47816 Spondylosis without myelopathy or radiculopathy, lumbar region: Secondary | ICD-10-CM | POA: Diagnosis not present

## 2023-12-05 DIAGNOSIS — M25562 Pain in left knee: Secondary | ICD-10-CM | POA: Diagnosis not present

## 2023-12-15 DIAGNOSIS — J4540 Moderate persistent asthma, uncomplicated: Secondary | ICD-10-CM | POA: Diagnosis not present

## 2023-12-15 DIAGNOSIS — E1165 Type 2 diabetes mellitus with hyperglycemia: Secondary | ICD-10-CM | POA: Diagnosis not present

## 2023-12-15 DIAGNOSIS — E119 Type 2 diabetes mellitus without complications: Secondary | ICD-10-CM | POA: Diagnosis not present

## 2024-01-05 DIAGNOSIS — M791 Myalgia, unspecified site: Secondary | ICD-10-CM | POA: Diagnosis not present

## 2024-01-05 DIAGNOSIS — G43719 Chronic migraine without aura, intractable, without status migrainosus: Secondary | ICD-10-CM | POA: Diagnosis not present

## 2024-01-05 DIAGNOSIS — M542 Cervicalgia: Secondary | ICD-10-CM | POA: Diagnosis not present

## 2024-01-09 DIAGNOSIS — M25511 Pain in right shoulder: Secondary | ICD-10-CM | POA: Diagnosis not present

## 2024-01-15 DIAGNOSIS — E119 Type 2 diabetes mellitus without complications: Secondary | ICD-10-CM | POA: Diagnosis not present

## 2024-01-15 DIAGNOSIS — J4540 Moderate persistent asthma, uncomplicated: Secondary | ICD-10-CM | POA: Diagnosis not present

## 2024-01-15 DIAGNOSIS — E1165 Type 2 diabetes mellitus with hyperglycemia: Secondary | ICD-10-CM | POA: Diagnosis not present

## 2024-01-23 DIAGNOSIS — M791 Myalgia, unspecified site: Secondary | ICD-10-CM | POA: Diagnosis not present

## 2024-01-23 DIAGNOSIS — M542 Cervicalgia: Secondary | ICD-10-CM | POA: Diagnosis not present

## 2024-01-23 DIAGNOSIS — G43719 Chronic migraine without aura, intractable, without status migrainosus: Secondary | ICD-10-CM | POA: Diagnosis not present

## 2024-01-29 DIAGNOSIS — K648 Other hemorrhoids: Secondary | ICD-10-CM | POA: Diagnosis not present

## 2024-01-29 DIAGNOSIS — D124 Benign neoplasm of descending colon: Secondary | ICD-10-CM | POA: Diagnosis not present

## 2024-01-29 DIAGNOSIS — Z09 Encounter for follow-up examination after completed treatment for conditions other than malignant neoplasm: Secondary | ICD-10-CM | POA: Diagnosis not present

## 2024-01-29 DIAGNOSIS — R195 Other fecal abnormalities: Secondary | ICD-10-CM | POA: Diagnosis not present

## 2024-01-29 DIAGNOSIS — Z860101 Personal history of adenomatous and serrated colon polyps: Secondary | ICD-10-CM | POA: Diagnosis not present

## 2024-01-29 DIAGNOSIS — K635 Polyp of colon: Secondary | ICD-10-CM | POA: Diagnosis not present

## 2024-01-29 DIAGNOSIS — D120 Benign neoplasm of cecum: Secondary | ICD-10-CM | POA: Diagnosis not present

## 2024-02-12 DIAGNOSIS — M542 Cervicalgia: Secondary | ICD-10-CM | POA: Diagnosis not present

## 2024-02-12 DIAGNOSIS — G43719 Chronic migraine without aura, intractable, without status migrainosus: Secondary | ICD-10-CM | POA: Diagnosis not present

## 2024-02-12 DIAGNOSIS — M791 Myalgia, unspecified site: Secondary | ICD-10-CM | POA: Diagnosis not present

## 2024-02-13 DIAGNOSIS — M170 Bilateral primary osteoarthritis of knee: Secondary | ICD-10-CM | POA: Diagnosis not present

## 2024-02-13 DIAGNOSIS — M25511 Pain in right shoulder: Secondary | ICD-10-CM | POA: Diagnosis not present

## 2024-02-15 DIAGNOSIS — J4540 Moderate persistent asthma, uncomplicated: Secondary | ICD-10-CM | POA: Diagnosis not present

## 2024-02-15 DIAGNOSIS — E119 Type 2 diabetes mellitus without complications: Secondary | ICD-10-CM | POA: Diagnosis not present

## 2024-02-15 DIAGNOSIS — E1165 Type 2 diabetes mellitus with hyperglycemia: Secondary | ICD-10-CM | POA: Diagnosis not present

## 2024-02-23 DIAGNOSIS — M961 Postlaminectomy syndrome, not elsewhere classified: Secondary | ICD-10-CM | POA: Diagnosis not present

## 2024-02-23 DIAGNOSIS — M47816 Spondylosis without myelopathy or radiculopathy, lumbar region: Secondary | ICD-10-CM | POA: Diagnosis not present

## 2024-02-23 DIAGNOSIS — Z79891 Long term (current) use of opiate analgesic: Secondary | ICD-10-CM | POA: Diagnosis not present

## 2024-03-09 DIAGNOSIS — M542 Cervicalgia: Secondary | ICD-10-CM | POA: Diagnosis not present

## 2024-03-09 DIAGNOSIS — M791 Myalgia, unspecified site: Secondary | ICD-10-CM | POA: Diagnosis not present

## 2024-03-09 DIAGNOSIS — G43719 Chronic migraine without aura, intractable, without status migrainosus: Secondary | ICD-10-CM | POA: Diagnosis not present

## 2024-03-16 DIAGNOSIS — E119 Type 2 diabetes mellitus without complications: Secondary | ICD-10-CM | POA: Diagnosis not present

## 2024-03-16 DIAGNOSIS — J4540 Moderate persistent asthma, uncomplicated: Secondary | ICD-10-CM | POA: Diagnosis not present

## 2024-03-16 DIAGNOSIS — E1165 Type 2 diabetes mellitus with hyperglycemia: Secondary | ICD-10-CM | POA: Diagnosis not present

## 2024-03-19 DIAGNOSIS — M170 Bilateral primary osteoarthritis of knee: Secondary | ICD-10-CM | POA: Diagnosis not present

## 2024-03-26 DIAGNOSIS — I499 Cardiac arrhythmia, unspecified: Secondary | ICD-10-CM | POA: Diagnosis not present

## 2024-03-26 DIAGNOSIS — R0981 Nasal congestion: Secondary | ICD-10-CM | POA: Diagnosis not present

## 2024-03-26 DIAGNOSIS — Z23 Encounter for immunization: Secondary | ICD-10-CM | POA: Diagnosis not present

## 2024-03-26 DIAGNOSIS — E1165 Type 2 diabetes mellitus with hyperglycemia: Secondary | ICD-10-CM | POA: Diagnosis not present

## 2024-03-26 DIAGNOSIS — M170 Bilateral primary osteoarthritis of knee: Secondary | ICD-10-CM | POA: Diagnosis not present

## 2024-03-26 DIAGNOSIS — M545 Low back pain, unspecified: Secondary | ICD-10-CM | POA: Diagnosis not present

## 2024-03-26 DIAGNOSIS — Z9181 History of falling: Secondary | ICD-10-CM | POA: Diagnosis not present

## 2024-03-26 DIAGNOSIS — I10 Essential (primary) hypertension: Secondary | ICD-10-CM | POA: Diagnosis not present

## 2024-03-26 DIAGNOSIS — E1159 Type 2 diabetes mellitus with other circulatory complications: Secondary | ICD-10-CM | POA: Diagnosis not present

## 2024-03-26 DIAGNOSIS — G43009 Migraine without aura, not intractable, without status migrainosus: Secondary | ICD-10-CM | POA: Diagnosis not present

## 2024-04-02 DIAGNOSIS — M170 Bilateral primary osteoarthritis of knee: Secondary | ICD-10-CM | POA: Diagnosis not present

## 2024-04-09 DIAGNOSIS — M170 Bilateral primary osteoarthritis of knee: Secondary | ICD-10-CM | POA: Diagnosis not present
